# Patient Record
Sex: Female | Born: 1995 | Race: White | Hispanic: No | Smoking: Former smoker
Health system: Southern US, Community
[De-identification: ages and names within clinical notes are randomized; demographics above are authoritative.]

## PROBLEM LIST (undated history)

## (undated) DIAGNOSIS — J449 Chronic obstructive pulmonary disease, unspecified: Secondary | ICD-10-CM

## (undated) DIAGNOSIS — F419 Anxiety disorder, unspecified: Secondary | ICD-10-CM

## (undated) DIAGNOSIS — J45909 Unspecified asthma, uncomplicated: Secondary | ICD-10-CM

---

## 2021-09-16 ENCOUNTER — Ambulatory Visit: Payer: Medicaid Other | Admitting: Sports Medicine

## 2021-09-16 ENCOUNTER — Other Ambulatory Visit: Payer: Self-pay

## 2021-09-16 ENCOUNTER — Encounter: Payer: Self-pay | Admitting: Sports Medicine

## 2021-09-16 DIAGNOSIS — L84 Corns and callosities: Secondary | ICD-10-CM

## 2021-09-16 DIAGNOSIS — R61 Generalized hyperhidrosis: Secondary | ICD-10-CM

## 2021-09-16 DIAGNOSIS — L603 Nail dystrophy: Secondary | ICD-10-CM

## 2021-09-16 DIAGNOSIS — M79672 Pain in left foot: Secondary | ICD-10-CM

## 2021-09-16 DIAGNOSIS — M79671 Pain in right foot: Secondary | ICD-10-CM

## 2021-09-16 NOTE — Patient Instructions (Signed)
Soak with black tea at bedtime Use deodorant spray to feet at bedtime Use foot miracle cream in the AM for dry skin Take OTC hair skin and nail vitamin for 5th toe nails Avoid shoes that could rub toes or that are too tight

## 2021-09-16 NOTE — Progress Notes (Signed)
Subjective: Kathryn Wiley is a 26 y.o. female patient who presents to office for evaluation of bilateral foot pain secondary to callus skin especially at the heels reports that the skin gets so hard cracks has tried Band-Aid and soaking but it seems like it just comes back and she gets frustrated and tired.  Patient also reports that she did dance when she was younger and had a history of her toenails falling off denies any significant swelling warmth redness or any signs of infection to the toes or toenails.  Patient reports that she tends to wear sandals because they are more comfortable.  There are no problems to display for this patient.   Current Outpatient Medications on File Prior to Visit  Medication Sig Dispense Refill   buPROPion ER (WELLBUTRIN SR) 100 MG 12 hr tablet Take 100 mg by mouth every morning.     SYMBICORT 160-4.5 MCG/ACT inhaler Inhale into the lungs.     valACYclovir (VALTREX) 500 MG tablet Take 500 mg by mouth daily.     VENTOLIN HFA 108 (90 Base) MCG/ACT inhaler Inhale into the lungs.     No current facility-administered medications on file prior to visit.    No Known Allergies  Objective:  General: Alert and oriented x3 in no acute distress  Dermatology: Keratotic lesion present diffusely plantar heels and ball of feet bilaterally with skin lines transversing the lesions, no ecchymosis bilateral, all nails x 10 are well manicured with mildly thickened fifth toenails bilateral.  Nails appear to be well attached with no surrounding signs of infection.  Vascular: Dorsalis Pedis and Posterior Tibial pedal pulses 2/4, Capillary Fill Time 3 seconds, + pedal hair growth bilateral, no edema bilateral lower extremities, Temperature gradient slightly increased consistent with hyperhidrosis  Neurology: Gross sensation intact via light touch bilateral.  Musculoskeletal: Mild tenderness with palpation at the keratotic lesion site bilateral.  Assessment and Plan: Problem List  Items Addressed This Visit   None Visit Diagnoses     Callus    -  Primary   Nail dystrophy       Foot pain, bilateral       Hyperhidrosis             -Complete examination performed -Discussed treatment options -Parred keratoic lesion using a rotary bur sanding down the callused areas at bilateral heels and plantar forefoot without incident  -Encouraged daily skin emollients; sample of foot miracle cream provided -Encouraged use of pumice stone at home on her own after soaking with black tea twice weekly -Recommend patient to also get over-the-counter spray deodorant to use at bedtime for hyperhidrosis -Advised good supportive shoes that do not rub -Patient to return to office in 1 month or sooner if condition worsens.  If condition continues patient may benefit from combination of antifungal and antibiotic treatment for questionable pityriasis  Asencion Islam, DPM

## 2021-10-17 ENCOUNTER — Encounter: Payer: Self-pay | Admitting: Sports Medicine

## 2021-10-17 ENCOUNTER — Ambulatory Visit: Payer: Medicaid Other | Admitting: Sports Medicine

## 2021-10-17 DIAGNOSIS — L210 Seborrhea capitis: Secondary | ICD-10-CM | POA: Diagnosis not present

## 2021-10-17 DIAGNOSIS — M79671 Pain in right foot: Secondary | ICD-10-CM

## 2021-10-17 DIAGNOSIS — M79672 Pain in left foot: Secondary | ICD-10-CM

## 2021-10-17 DIAGNOSIS — R61 Generalized hyperhidrosis: Secondary | ICD-10-CM

## 2021-10-17 DIAGNOSIS — L84 Corns and callosities: Secondary | ICD-10-CM | POA: Diagnosis not present

## 2021-10-17 DIAGNOSIS — L603 Nail dystrophy: Secondary | ICD-10-CM

## 2021-10-17 MED ORDER — BENZOYL PEROXIDE-ERYTHROMYCIN 5-3 % EX GEL: Freq: Two times a day (BID) | CUTANEOUS | 0 refills | Status: DC

## 2021-10-17 NOTE — Progress Notes (Signed)
Subjective: Kathryn Wiley is a 26 y.o. female patient who returns to office for follow up of callus skin, sweaty feet, and thick nails, reports that things are getting better, reports that she still has to get the spray deodorant, has new sandals that are very comfortable.  There are no problems to display for this patient.   Current Outpatient Medications on File Prior to Visit  Medication Sig Dispense Refill   buPROPion ER (WELLBUTRIN SR) 100 MG 12 hr tablet Take 100 mg by mouth every morning.     SYMBICORT 160-4.5 MCG/ACT inhaler Inhale into the lungs.     valACYclovir (VALTREX) 500 MG tablet Take 500 mg by mouth daily.     VENTOLIN HFA 108 (90 Base) MCG/ACT inhaler Inhale into the lungs.     No current facility-administered medications on file prior to visit.    No Known Allergies  Objective:  General: Alert and oriented x3 in no acute distress  Dermatology: Keratotic lesion present diffusely plantar heels and ball of feet bilaterally with skin lines transversing the lesions and small pits consistent with pityriasis,  no ecchymosis bilateral, all nails x 10 are well manicured with mildly thickened fifth toenails bilateral.  Nails appear to be well attached with no surrounding signs of infection.  Vascular: Dorsalis Pedis and Posterior Tibial pedal pulses 2/4, Capillary Fill Time 3 seconds, + pedal hair growth bilateral, no edema bilateral lower extremities, Temperature gradient slightly increased consistent with hyperhidrosis  Neurology: Gross sensation intact via light touch bilateral.  Musculoskeletal: Mild tenderness with palpation at the keratotic lesion site bilateral.  Assessment and Plan: Problem List Items Addressed This Visit   None Visit Diagnoses     Callus    -  Primary   Pityriasis       Nail dystrophy       Hyperhidrosis       Foot pain, bilateral          -Complete examination performed -Re-Discussed treatment options -Parred keratoic lesion on right heel  using a 15 blade at the plantar heel without incident -Encouraged daily skin emollients and use of pumice stone at home on her own after soaking with black tea twice-three times weekly -Recommend patient to get the over-the-counter spray deodorant to use at bedtime for hyperhidrosis -Rx Erythromicin gel for patient to use as directed  -Advised good supportive shoes that do not rub and to continue with her cushioned sandals -Patient to return to office in 1 month or sooner if condition worsens.  If condition continues may benefit from lamisil if fails to continue to improve  Asencion Islam, DPM

## 2021-11-14 ENCOUNTER — Ambulatory Visit: Payer: Medicaid Other | Admitting: Sports Medicine

## 2022-01-29 ENCOUNTER — Emergency Department (HOSPITAL_BASED_OUTPATIENT_CLINIC_OR_DEPARTMENT_OTHER): Payer: Medicaid Other

## 2022-01-29 ENCOUNTER — Emergency Department (HOSPITAL_BASED_OUTPATIENT_CLINIC_OR_DEPARTMENT_OTHER)
Admission: EM | Admit: 2022-01-29 | Discharge: 2022-01-29 | Disposition: A | Discharge status: Home / Self Care | Payer: Medicaid Other | Attending: Emergency Medicine | Admitting: Emergency Medicine

## 2022-01-29 ENCOUNTER — Other Ambulatory Visit: Payer: Self-pay

## 2022-01-29 ENCOUNTER — Encounter (HOSPITAL_BASED_OUTPATIENT_CLINIC_OR_DEPARTMENT_OTHER): Payer: Self-pay

## 2022-01-29 DIAGNOSIS — R110 Nausea: Secondary | ICD-10-CM | POA: Insufficient documentation

## 2022-01-29 DIAGNOSIS — R1084 Generalized abdominal pain: Secondary | ICD-10-CM | POA: Insufficient documentation

## 2022-01-29 DIAGNOSIS — R1013 Epigastric pain: Secondary | ICD-10-CM | POA: Diagnosis present

## 2022-01-29 HISTORY — DX: Chronic obstructive pulmonary disease, unspecified: J44.9

## 2022-01-29 HISTORY — DX: Unspecified asthma, uncomplicated: J45.909

## 2022-01-29 HISTORY — DX: Anxiety disorder, unspecified: F41.9

## 2022-01-29 LAB — COMPREHENSIVE METABOLIC PANEL
ALT: 20 U/L (ref 0–44)
AST: 15 U/L (ref 15–41)
Albumin: 3.8 g/dL (ref 3.5–5.0)
Alkaline Phosphatase: 75 U/L (ref 38–126)
Anion gap: 4 — ABNORMAL LOW (ref 5–15)
BUN: 11 mg/dL (ref 6–20)
CO2: 24 mmol/L (ref 22–32)
Calcium: 8.9 mg/dL (ref 8.9–10.3)
Chloride: 110 mmol/L (ref 98–111)
Creatinine, Ser: 0.66 mg/dL (ref 0.44–1.00)
GFR, Estimated: >60 mL/min (ref >60)
Glucose, Bld: 89 mg/dL (ref 70–99)
Potassium: 4 mmol/L (ref 3.5–5.1)
Sodium: 138 mmol/L (ref 135–145)
Total Bilirubin: 0.7 mg/dL (ref 0.3–1.2)
Total Protein: 6.9 g/dL (ref 6.5–8.1)

## 2022-01-29 LAB — URINALYSIS, ROUTINE W REFLEX MICROSCOPIC
Bilirubin Urine: NEGATIVE
Glucose, UA: NEGATIVE mg/dL
Hgb urine dipstick: NEGATIVE
Ketones, ur: NEGATIVE mg/dL
Leukocytes,Ua: NEGATIVE
Nitrite: NEGATIVE
Protein, ur: NEGATIVE mg/dL
Specific Gravity, Urine: 1.025 (ref 1.005–1.030)
pH: 7 (ref 5.0–8.0)

## 2022-01-29 LAB — CBC WITH DIFFERENTIAL/PLATELET
Abs Immature Granulocytes: 0.02 10*3/uL (ref 0.00–0.07)
Basophils Absolute: 0 10*3/uL (ref 0.0–0.1)
Basophils Relative: 0 %
Eosinophils Absolute: 0.2 10*3/uL (ref 0.0–0.5)
Eosinophils Relative: 2 %
HCT: 43.7 % (ref 36.0–46.0)
Hemoglobin: 14.8 g/dL (ref 12.0–15.0)
Immature Granulocytes: 0 %
Lymphocytes Relative: 33 %
Lymphs Abs: 2.7 10*3/uL (ref 0.7–4.0)
MCH: 30.8 pg (ref 26.0–34.0)
MCHC: 33.9 g/dL (ref 30.0–36.0)
MCV: 90.9 fL (ref 80.0–100.0)
Monocytes Absolute: 0.5 10*3/uL (ref 0.1–1.0)
Monocytes Relative: 6 %
Neutro Abs: 4.7 10*3/uL (ref 1.7–7.7)
Neutrophils Relative %: 59 %
Platelets: 253 10*3/uL (ref 150–400)
RBC: 4.81 MIL/uL (ref 3.87–5.11)
RDW: 13.5 % (ref 11.5–15.5)
WBC: 8.1 10*3/uL (ref 4.0–10.5)
nRBC: 0 % (ref 0.0–0.2)

## 2022-01-29 LAB — PREGNANCY, URINE: Preg Test, Ur: NEGATIVE

## 2022-01-29 LAB — LIPASE, BLOOD: Lipase: 24 U/L (ref 11–51)

## 2022-01-29 IMAGING — CT CT ABD-PELV W/ CM
2 of 4 series · 16 of 46 positions shown, 18 images · IV contrast (Omnipaque)
Comparison: None Available.

CLINICAL DATA: Acute abdominal pain nonlocalized.

EXAM:
CT ABDOMEN AND PELVIS WITH CONTRAST
TECHNIQUE: Multidetector CT imaging of the abdomen and pelvis was performed
using the standard protocol following bolus administration of
intravenous contrast.

[Series 2: axial st · axial · 0.98mm/px · z∈[-678,-263]mm · 13 of 91 slices shown, 15 images]
[im 4/91  soft-tissue]
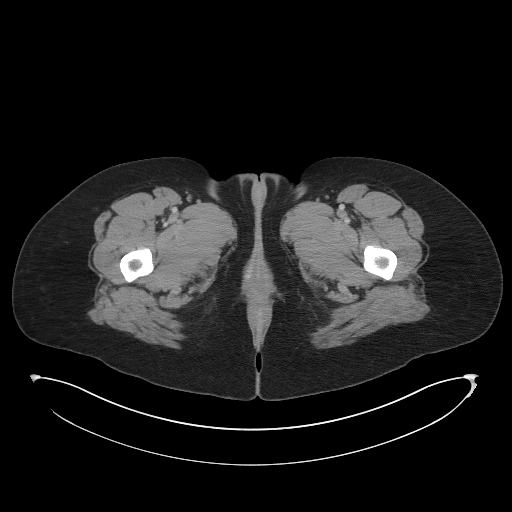
[im 4/91  bone]
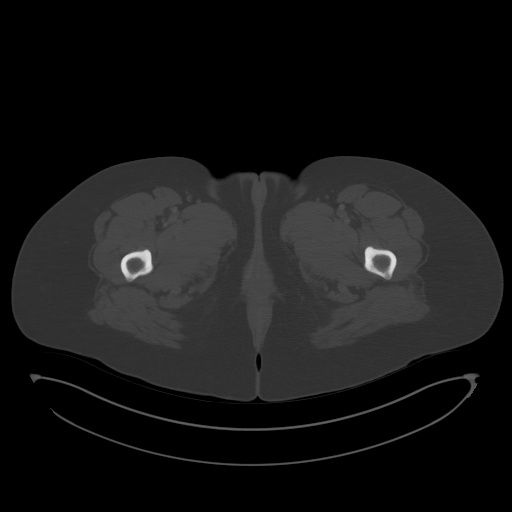
[im 12/91  soft-tissue]
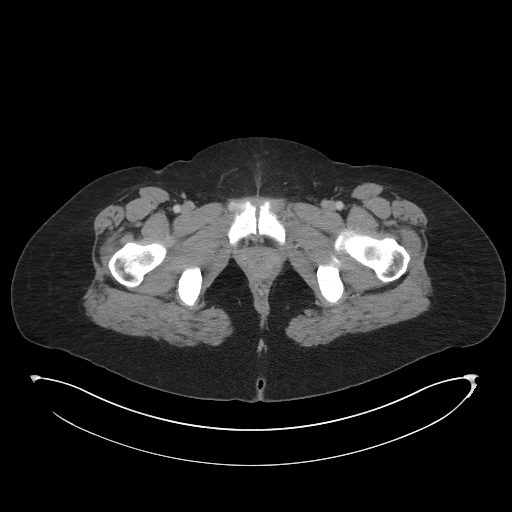
[im 19/91  soft-tissue]
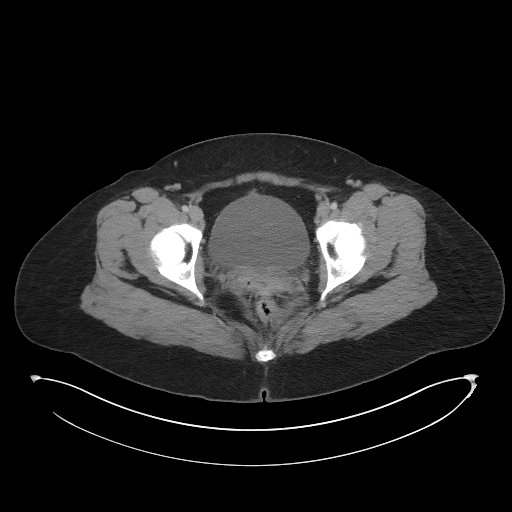
[im 27/91  soft-tissue]
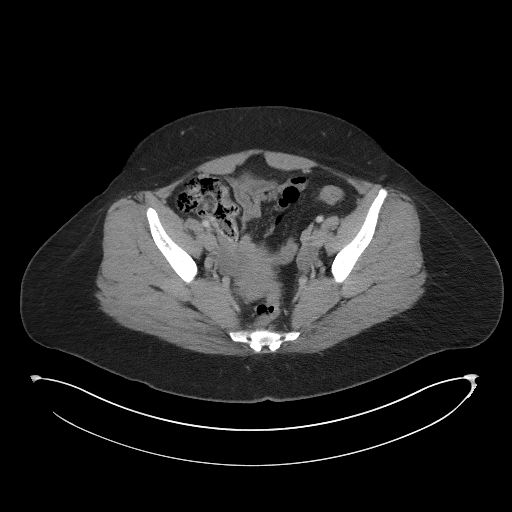
[im 31/91  soft-tissue]
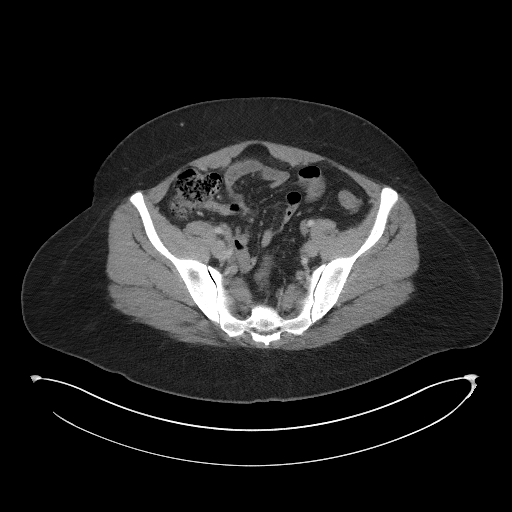
[im 38/91  soft-tissue]
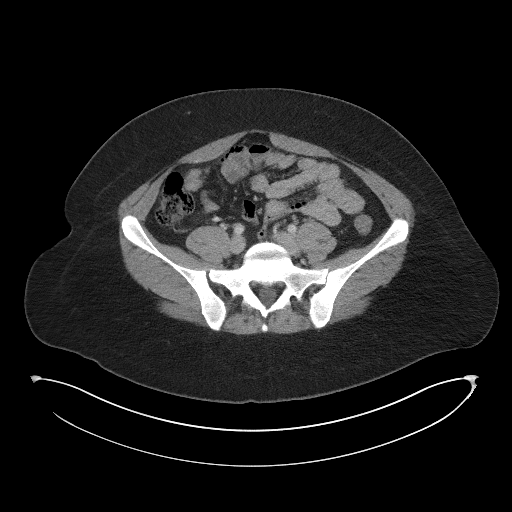
[im 46/91  soft-tissue]
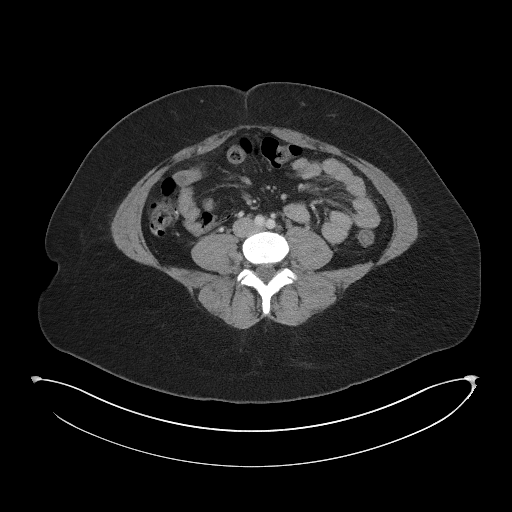
[im 53/91  soft-tissue]
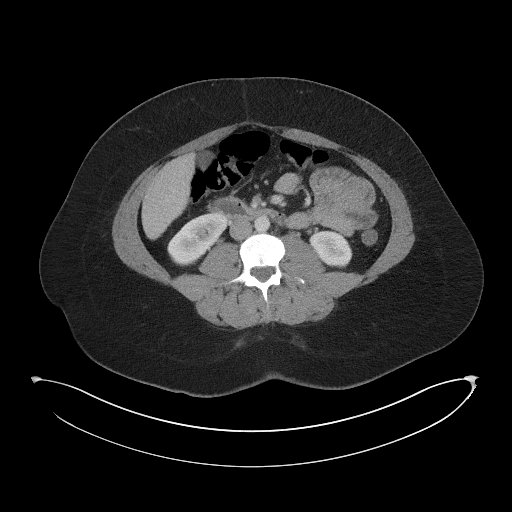
[im 61/91  soft-tissue]
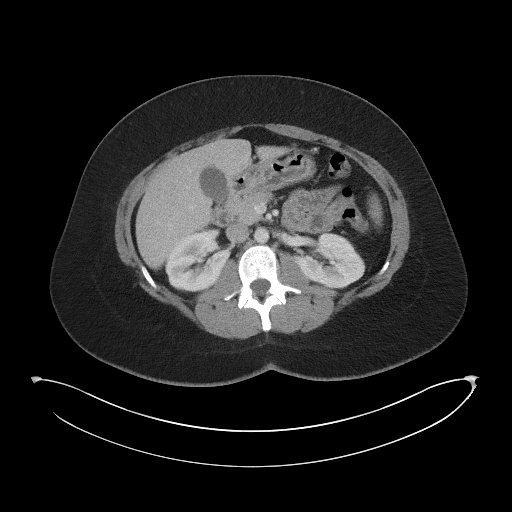
[im 61/91  bone]
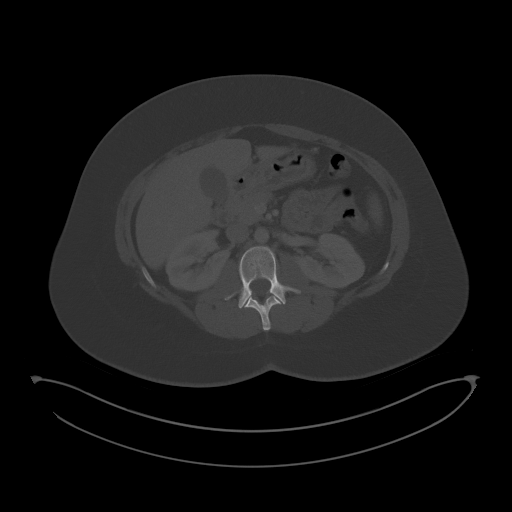
[im 64/91  soft-tissue]
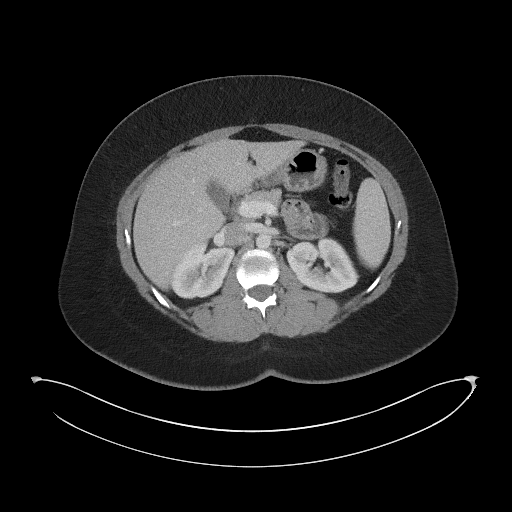
[im 72/91  soft-tissue]
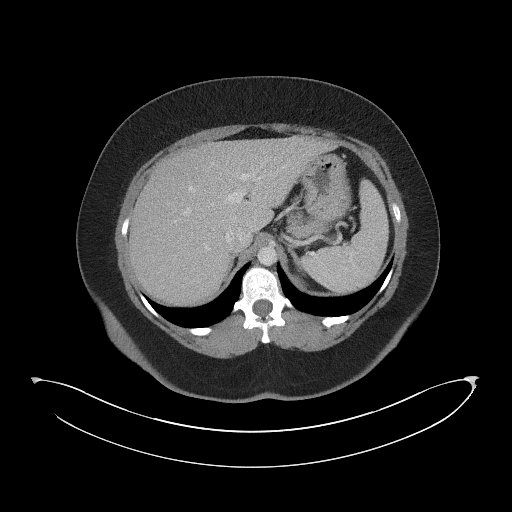
[im 79/91  soft-tissue]
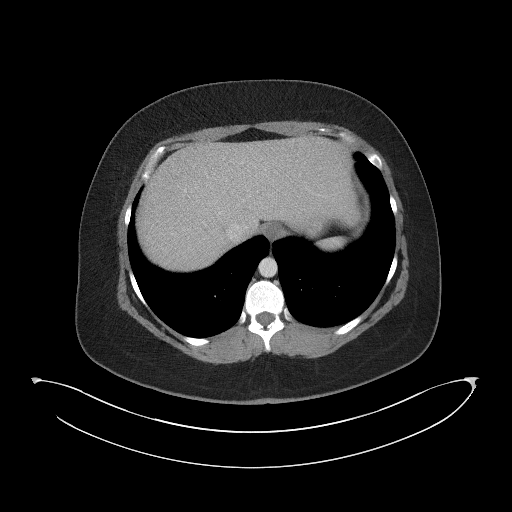
[im 87/91  soft-tissue]
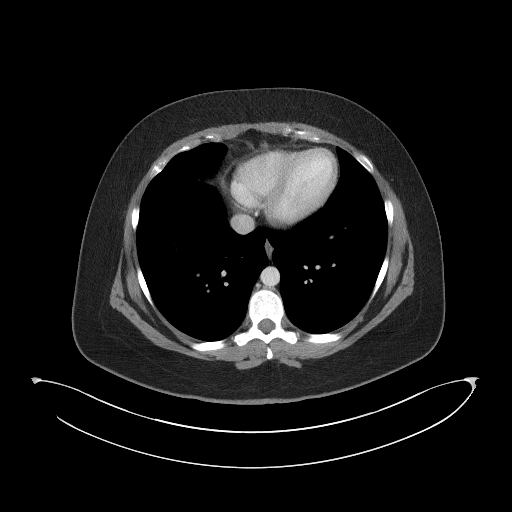

[Series 5: coronal st · coronal · 0.95mm/px · 3 of 118 slices shown]
[im 40/118  soft-tissue]
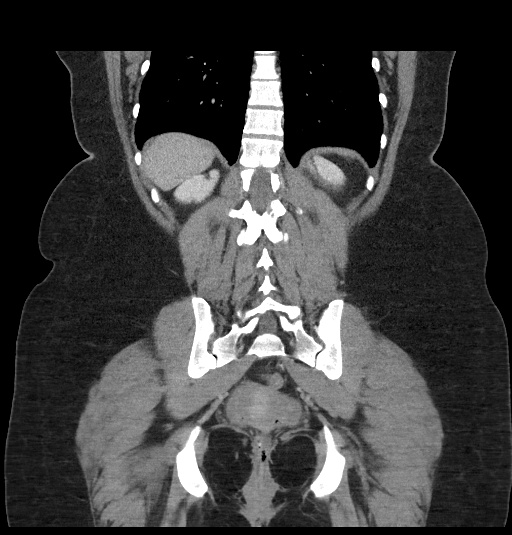
[im 53/118  soft-tissue]
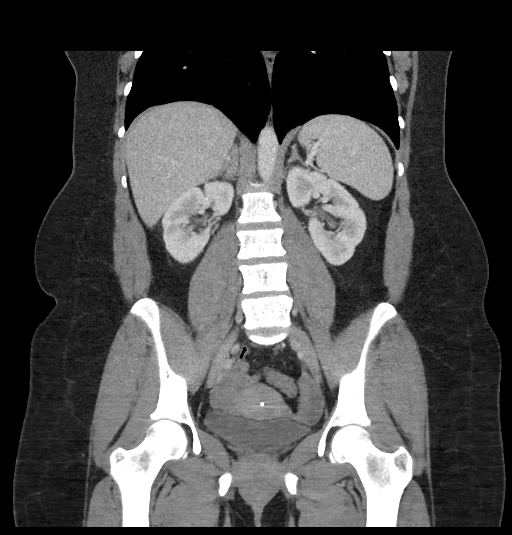
[im 66/118  soft-tissue]
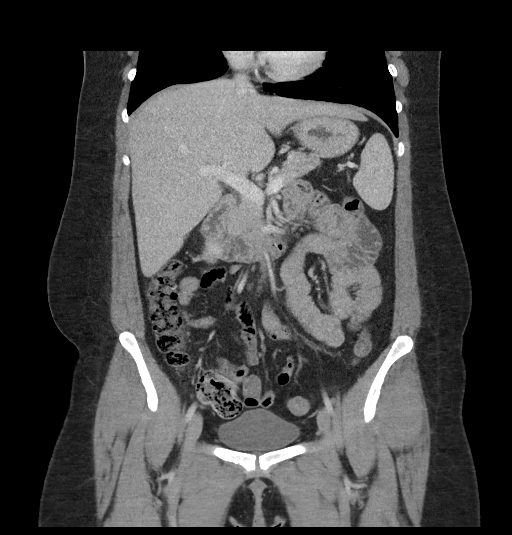

[16 of 46 positions shown; findings below may reference images not displayed]

RADIATION DOSE REDUCTION: This exam was performed according to the
departmental dose-optimization program which includes automated
exposure control, adjustment of the mA and/or kV according to
patient size and/or use of iterative reconstruction technique.

CONTRAST:  100mL OMNIPAQUE IOHEXOL 300 MG/ML  SOLN
FINDINGS: Lower chest: Patchy ground-glass opacities in the right lung base
are favored infectious or inflammatory.

Hepatobiliary: No suspicious hepatic lesion. Gallbladder is
unremarkable. No biliary ductal dilation.

Pancreas: No pancreatic ductal dilation or evidence of acute
inflammation.

Spleen: No splenomegaly or focal splenic lesion.

Adrenals/Urinary Tract: Bilateral adrenal glands appear normal. No
hydronephrosis. Punctate nonobstructive left lower pole renal
calculus. Kidneys demonstrate symmetric enhancement. Urinary bladder
is unremarkable for degree of distension.

Stomach/Bowel: No radiopaque enteric contrast material was
administered. Stomach is unremarkable for degree of distension. No
pathologic dilation of large or small bowel. The appendix and
terminal ileum appear normal. Few scattered colonic diverticula
without findings of acute diverticulitis.

Vascular/Lymphatic: Normal caliber abdominal aorta. No
pathologically enlarged abdominal or pelvic lymph nodes.

Reproductive: Intrauterine device appears appropriate in
positioning. No suspicious adnexal mass.

Other: Trace pelvic free fluid is within physiologic normal limits.

Musculoskeletal: No acute osseous abnormality.
IMPRESSION: 1. No acute abnormality in the abdomen or pelvis.
2. Patchy ground-glass opacities in the right lung base are favored
infectious or inflammatory.
3. Scattered colonic diverticulosis without findings of acute
diverticulitis.

## 2022-01-29 MED ORDER — IOHEXOL 300 MG/ML  SOLN
100.0000 mL | Freq: Once | INTRAMUSCULAR | Status: AC | PRN
  Administered 2022-01-29: 100 mL via INTRAVENOUS

## 2022-01-29 MED ORDER — HYDROMORPHONE HCL 1 MG/ML IJ SOLN
1.0000 mg | Freq: Once | INTRAMUSCULAR | Status: AC
  Administered 2022-01-29: 1 mg via INTRAVENOUS
  Filled 2022-01-29: qty 1

## 2022-01-29 MED ORDER — PROMETHAZINE HCL 25 MG RE SUPP: 25.0000 mg | Freq: Four times a day (QID) | RECTAL | 0 refills | Status: DC | PRN

## 2022-01-29 MED ORDER — MORPHINE SULFATE (PF) 4 MG/ML IV SOLN
4.0000 mg | Freq: Once | INTRAVENOUS | Status: AC
  Administered 2022-01-29: 4 mg via INTRAVENOUS
  Filled 2022-01-29: qty 1

## 2022-01-29 MED ORDER — SODIUM CHLORIDE 0.9 % IV BOLUS
1000.0000 mL | Freq: Once | INTRAVENOUS | Status: AC
  Administered 2022-01-29: 1000 mL via INTRAVENOUS

## 2022-01-29 MED ORDER — ONDANSETRON HCL 4 MG/2ML IJ SOLN
4.0000 mg | Freq: Once | INTRAMUSCULAR | Status: AC
  Administered 2022-01-29: 4 mg via INTRAVENOUS
  Filled 2022-01-29: qty 2

## 2022-01-29 NOTE — Discharge Instructions (Signed)
Please read the information about bland diet is attached to these discharge papers.  This is what you should follow until you are able to tolerate heavier and more flavorful fluid.  Follow-up with the gastroenterologist attached to these discharge papers.  It is good for you to be established with 1 in the event of recurring symptoms.  Especially with a family history of IBS/IBD.

## 2022-01-29 NOTE — ED Triage Notes (Signed)
Patient c/o mid abd pressure/cramping now traveling to right side of abd with nauea.

## 2022-01-29 NOTE — ED Provider Notes (Signed)
[facility] Provider Note   CSN: [id number] Arrival date & time: 01/29/22  1350     History  Chief Complaint  Patient presents with   Abdominal Pain   Nausea    Kathryn Wiley is a 26 y.o. female with no known past medical history presenting today with abdominal pain.  Says that it started yesterday in the epigastrium/periumbilical region and has migrated to the right side of the abdomen.  She has had associated nausea but this was relieved with at home Zofran.  Says she is having bowel movements but they are mostly "fatty."  Took Imodium and Pepto-Bismol yesterday and today without any relief.  No history of abdominal surgery, antibiotic use, travel or changes in diet.  No fevers or chills.  No urinary or vaginal symptoms.  Has an IUD that she has had for 3 years, no longer gets menstrual periods.   Abdominal Pain Associated symptoms: nausea   Associated symptoms: no dysuria, no vaginal bleeding and no vaginal discharge        Home Medications Prior to Admission medications   Medication Sig Start Date End Date Taking? Authorizing Provider  benzoyl peroxide-erythromycin (BENZAMYCIN) gel Apply topically 2 (two) times daily. 10/17/21   Landis Martins, DPM  buPROPion ER (WELLBUTRIN SR) 100 MG 12 hr tablet Take 100 mg by mouth every morning. 09/01/21   [provider]  SYMBICORT 160-4.5 MCG/ACT inhaler Inhale into the lungs. 07/02/21   [provider]  valACYclovir (VALTREX) 500 MG tablet Take 500 mg by mouth daily. 09/15/21   [provider]  VENTOLIN HFA 108 (90 Base) MCG/ACT inhaler Inhale into the lungs. 07/01/21   [provider]      Allergies    Patient has no known allergies.    Review of Systems   Review of Systems  Gastrointestinal:  Positive for abdominal pain and nausea.  Genitourinary:  Negative for dysuria, pelvic pain, vaginal bleeding, vaginal discharge and vaginal pain.   Physical Exam Updated Vital  Signs BP (!) 142/101 (BP Location: Left Arm)   Pulse 91   Temp 97.8 F (36.6 C) (Oral)   Resp 18   Ht 5\' 3"  (1.6 m)   Wt 92.5 kg   LMP  (LMP Unknown)   SpO2 100%   BMI 36.14 kg/m  Physical Exam Vitals and nursing note reviewed.  Constitutional:      Appearance: Normal appearance.  HENT:     Head: Normocephalic and atraumatic.  Eyes:     General: No scleral icterus.    Conjunctiva/sclera: Conjunctivae normal.  Cardiovascular:     Rate and Rhythm: Normal rate and regular rhythm.  Pulmonary:     Effort: Pulmonary effort is normal. No respiratory distress.  Abdominal:     Palpations: Abdomen is soft.     Tenderness: There is abdominal tenderness in the right upper quadrant, right lower quadrant, periumbilical area and left lower quadrant.     Hernia: No hernia is present.  Skin:    Findings: No rash.  Neurological:     Mental Status: She is alert.  Psychiatric:        Mood and Affect: Mood normal.     ED Results / Procedures / Treatments   Labs (all labs ordered are listed, but only abnormal results are displayed) Labs Reviewed  COMPREHENSIVE METABOLIC PANEL - Abnormal; Notable for the following components:      Result Value   Anion gap 4 (*)    All other components  within normal limits  LIPASE, BLOOD  CBC WITH DIFFERENTIAL/PLATELET  URINALYSIS, ROUTINE W REFLEX MICROSCOPIC  PREGNANCY, URINE    EKG None  Radiology CT ABDOMEN PELVIS W CONTRAST  Result Date: 01/29/2022 CLINICAL DATA:  Acute abdominal pain nonlocalized. EXAM: CT ABDOMEN AND PELVIS WITH CONTRAST TECHNIQUE: Multidetector CT imaging of the abdomen and pelvis was performed using the standard protocol following bolus administration of intravenous contrast. RADIATION DOSE REDUCTION: This exam was performed according to the departmental dose-optimization program which includes automated exposure control, adjustment of the mA and/or kV according to patient size and/or use of iterative reconstruction  technique. CONTRAST:  165mL OMNIPAQUE IOHEXOL 300 MG/ML  SOLN COMPARISON:  None Available. FINDINGS: Lower chest: Patchy ground-glass opacities in the right lung base are favored infectious or inflammatory. Hepatobiliary: No suspicious hepatic lesion. Gallbladder is unremarkable. No biliary ductal dilation. Pancreas: No pancreatic ductal dilation or evidence of acute inflammation. Spleen: No splenomegaly or focal splenic lesion. Adrenals/Urinary Tract: Bilateral adrenal glands appear normal. No hydronephrosis. Punctate nonobstructive left lower pole renal calculus. Kidneys demonstrate symmetric enhancement. Urinary bladder is unremarkable for degree of distension. Stomach/Bowel: No radiopaque enteric contrast material was administered. Stomach is unremarkable for degree of distension. No pathologic dilation of large or small bowel. The appendix and terminal ileum appear normal. Few scattered colonic diverticula without findings of acute diverticulitis. Vascular/Lymphatic: Normal caliber abdominal aorta. No pathologically enlarged abdominal or pelvic lymph nodes. Reproductive: Intrauterine device appears appropriate in positioning. No suspicious adnexal mass. Other: Trace pelvic free fluid is within physiologic normal limits. Musculoskeletal: No acute osseous abnormality. IMPRESSION: 1. No acute abnormality in the abdomen or pelvis. 2. Patchy ground-glass opacities in the right lung base are favored infectious or inflammatory. 3. Scattered colonic diverticulosis without findings of acute diverticulitis. Electronically Signed   By: Dahlia Bailiff M.D.   On: 01/29/2022 16:09    Procedures Procedures    Medications Ordered in ED Medications  HYDROmorphone (DILAUDID) injection 1 mg (has no administration in time range)  sodium chloride 0.9 % bolus 1,000 mL (0 mLs Intravenous Stopped 01/29/22 1614)  ondansetron (ZOFRAN) injection 4 mg (4 mg Intravenous Given 01/29/22 1446)  morphine (PF) 4 MG/ML injection 4 mg (4  mg Intravenous Given 01/29/22 1445)  iohexol (OMNIPAQUE) 300 MG/ML solution 100 mL (100 mLs Intravenous Contrast Given 01/29/22 1556)    ED Course/ Medical Decision Making/ A&P                           Medical Decision Making Amount and/or Complexity of Data Reviewed Labs: ordered. Radiology: ordered.  Risk Prescription drug management.   This patient presents to the ED for concern of abdominal pain and nausea. The differential diagnosis for generalized abdominal pain includes, but is not limited to AAA, gastroenteritis, appendicitis, Bowel obstruction, Bowel perforation. Gastroparesis, DKA, Hernia, Inflammatory bowel disease, mesenteric ischemia, pancreatitis, peritonitis SBP, volvulus.    This is not an exhaustive differential.    Past Medical History / Co-morbidities / Social History: Obesity   Additional history: No previous records   Physical Exam: Physical exam performed. The pertinent findings include: Generalized abdominal tenderness  Lab Tests: I ordered, and personally interpreted labs.  The pertinent results include: Completely negative labs   Imaging Studies: I ordered imaging studies including CTAP. I independently visualized and interpreted imaging which showed no explantions of her pain. I agree with the radiologist interpretation.    Medications: I ordered medication including IVF, morphine and zofran.  Reevaluation shows  consistent pain.  Given Dilaudid.  Reevaluation of the patient after these medicines showed that the patient improved. I have reviewed the patients home medicines and have made adjustments as needed.  Patient is without epigastric pain, no GI cocktail attempted at this time.   Disposition: After consideration of the diagnostic results and the patients response to treatment, I feel that patient is stable for discharge home.  Vital signs of been stable since her arrival.  Afebrile, not tachycardic.  Normal white count.  CT abdomen pelvis  questioned right lower lobe pneumonia however patient has no symptoms of this.  Not short of breath.  Again, vital signs are stable.  Will not treat for pneumonia especially being that the antibiotics may cause further GI upset.  She is agreeable to this.  I suspect that she is suffering from a gastroenteritis or potentially new onset IBS/IBD.  She will follow-up with GI.  She and her partner are agreeable to this.  P.o. challenged successfully will be discharged in stable condition.    Final Clinical Impression(s) / ED Diagnoses Final diagnoses:  Generalized abdominal pain  Nausea    Rx / DC Orders ED Discharge Orders          Ordered    promethazine (PHENERGAN) 25 MG suppository  Every 6 hours PRN        01/29/22 1650           Results and diagnoses were explained to the patient. Return precautions discussed in full. Patient had no additional questions and expressed complete understanding.   This chart was dictated using voice recognition software.  Despite best efforts to proofread,  errors can occur which can change the documentation meaning.    Darliss Ridgel 01/29/22 1741    Wyvonnia Dusky, MD 01/29/22 1745

## 2022-01-31 ENCOUNTER — Inpatient Hospital Stay (HOSPITAL_BASED_OUTPATIENT_CLINIC_OR_DEPARTMENT_OTHER)
Admission: EM | Admit: 2022-01-31 | Discharge: 2022-02-04 | DRG: 392 | Disposition: A | Discharge status: Home / Self Care | Payer: Medicaid Other | Attending: Internal Medicine | Admitting: Internal Medicine

## 2022-01-31 ENCOUNTER — Encounter (HOSPITAL_BASED_OUTPATIENT_CLINIC_OR_DEPARTMENT_OTHER): Payer: Self-pay | Admitting: Emergency Medicine

## 2022-01-31 ENCOUNTER — Emergency Department (HOSPITAL_BASED_OUTPATIENT_CLINIC_OR_DEPARTMENT_OTHER): Payer: Medicaid Other

## 2022-01-31 DIAGNOSIS — F1210 Cannabis abuse, uncomplicated: Secondary | ICD-10-CM | POA: Diagnosis present

## 2022-01-31 DIAGNOSIS — E876 Hypokalemia: Secondary | ICD-10-CM | POA: Diagnosis present

## 2022-01-31 DIAGNOSIS — Z79899 Other long term (current) drug therapy: Secondary | ICD-10-CM

## 2022-01-31 DIAGNOSIS — F1910 Other psychoactive substance abuse, uncomplicated: Secondary | ICD-10-CM | POA: Diagnosis present

## 2022-01-31 DIAGNOSIS — J45909 Unspecified asthma, uncomplicated: Secondary | ICD-10-CM | POA: Diagnosis present

## 2022-01-31 DIAGNOSIS — F1290 Cannabis use, unspecified, uncomplicated: Secondary | ICD-10-CM | POA: Diagnosis present

## 2022-01-31 DIAGNOSIS — A0811 Acute gastroenteropathy due to Norwalk agent: Principal | ICD-10-CM | POA: Diagnosis present

## 2022-01-31 DIAGNOSIS — E669 Obesity, unspecified: Secondary | ICD-10-CM | POA: Diagnosis present

## 2022-01-31 DIAGNOSIS — F419 Anxiety disorder, unspecified: Secondary | ICD-10-CM | POA: Diagnosis present

## 2022-01-31 DIAGNOSIS — Z6837 Body mass index (BMI) 37.0-37.9, adult: Secondary | ICD-10-CM

## 2022-01-31 DIAGNOSIS — F17210 Nicotine dependence, cigarettes, uncomplicated: Secondary | ICD-10-CM | POA: Diagnosis present

## 2022-01-31 DIAGNOSIS — R112 Nausea with vomiting, unspecified: Secondary | ICD-10-CM | POA: Diagnosis present

## 2022-01-31 DIAGNOSIS — J441 Chronic obstructive pulmonary disease with (acute) exacerbation: Secondary | ICD-10-CM | POA: Diagnosis present

## 2022-01-31 DIAGNOSIS — R1084 Generalized abdominal pain: Principal | ICD-10-CM

## 2022-01-31 DIAGNOSIS — N760 Acute vaginitis: Secondary | ICD-10-CM | POA: Diagnosis present

## 2022-01-31 DIAGNOSIS — B9689 Other specified bacterial agents as the cause of diseases classified elsewhere: Secondary | ICD-10-CM | POA: Diagnosis present

## 2022-01-31 DIAGNOSIS — K920 Hematemesis: Secondary | ICD-10-CM | POA: Diagnosis present

## 2022-01-31 DIAGNOSIS — Z7951 Long term (current) use of inhaled steroids: Secondary | ICD-10-CM

## 2022-01-31 LAB — URINALYSIS, ROUTINE W REFLEX MICROSCOPIC
Bilirubin Urine: NEGATIVE
Glucose, UA: NEGATIVE mg/dL
Hgb urine dipstick: NEGATIVE
Ketones, ur: 15 mg/dL — AB
Leukocytes,Ua: NEGATIVE
Nitrite: NEGATIVE
Protein, ur: NEGATIVE mg/dL
Specific Gravity, Urine: 1.025 (ref 1.005–1.030)
pH: 5.5 (ref 5.0–8.0)

## 2022-01-31 LAB — CBC WITH DIFFERENTIAL/PLATELET
Abs Immature Granulocytes: 0.05 10*3/uL (ref 0.00–0.07)
Basophils Absolute: 0 10*3/uL (ref 0.0–0.1)
Basophils Relative: 0 %
Eosinophils Absolute: 0 10*3/uL (ref 0.0–0.5)
Eosinophils Relative: 0 %
HCT: 44.7 % (ref 36.0–46.0)
Hemoglobin: 15.4 g/dL — ABNORMAL HIGH (ref 12.0–15.0)
Immature Granulocytes: 0 %
Lymphocytes Relative: 3 %
Lymphs Abs: 0.5 10*3/uL — ABNORMAL LOW (ref 0.7–4.0)
MCH: 30.3 pg (ref 26.0–34.0)
MCHC: 34.5 g/dL (ref 30.0–36.0)
MCV: 87.8 fL (ref 80.0–100.0)
Monocytes Absolute: 0.4 10*3/uL (ref 0.1–1.0)
Monocytes Relative: 3 %
Neutro Abs: 13.6 10*3/uL — ABNORMAL HIGH (ref 1.7–7.7)
Neutrophils Relative %: 94 %
Platelets: 260 10*3/uL (ref 150–400)
RBC: 5.09 MIL/uL (ref 3.87–5.11)
RDW: 13.2 % (ref 11.5–15.5)
WBC: 14.6 10*3/uL — ABNORMAL HIGH (ref 4.0–10.5)
nRBC: 0 % (ref 0.0–0.2)

## 2022-01-31 LAB — COMPREHENSIVE METABOLIC PANEL
ALT: 22 U/L (ref 0–44)
AST: 17 U/L (ref 15–41)
Albumin: 4.3 g/dL (ref 3.5–5.0)
Alkaline Phosphatase: 85 U/L (ref 38–126)
Anion gap: 8 (ref 5–15)
BUN: 14 mg/dL (ref 6–20)
CO2: 19 mmol/L — ABNORMAL LOW (ref 22–32)
Calcium: 9.3 mg/dL (ref 8.9–10.3)
Chloride: 112 mmol/L — ABNORMAL HIGH (ref 98–111)
Creatinine, Ser: 0.71 mg/dL (ref 0.44–1.00)
GFR, Estimated: >60 mL/min (ref >60)
Glucose, Bld: 119 mg/dL — ABNORMAL HIGH (ref 70–99)
Potassium: 3.8 mmol/L (ref 3.5–5.1)
Sodium: 139 mmol/L (ref 135–145)
Total Bilirubin: 0.9 mg/dL (ref 0.3–1.2)
Total Protein: 7.5 g/dL (ref 6.5–8.1)

## 2022-01-31 LAB — WET PREP, GENITAL
Sperm: NONE SEEN
Trich, Wet Prep: NONE SEEN
WBC, Wet Prep HPF POC: <10 (ref <10)
Yeast Wet Prep HPF POC: NONE SEEN

## 2022-01-31 LAB — PREGNANCY, URINE: Preg Test, Ur: NEGATIVE

## 2022-01-31 LAB — LIPASE, BLOOD: Lipase: 22 U/L (ref 11–51)

## 2022-01-31 MED ORDER — HYDROMORPHONE HCL 1 MG/ML IJ SOLN
1.0000 mg | Freq: Once | INTRAMUSCULAR | Status: AC
  Administered 2022-01-31: 1 mg via INTRAVENOUS
  Filled 2022-01-31: qty 1

## 2022-01-31 MED ORDER — LACTATED RINGERS IV BOLUS
2000.0000 mL | Freq: Once | INTRAVENOUS | Status: AC
  Administered 2022-01-31: 2000 mL via INTRAVENOUS

## 2022-01-31 MED ORDER — IOHEXOL 300 MG/ML  SOLN
100.0000 mL | Freq: Once | INTRAMUSCULAR | Status: AC | PRN
  Administered 2022-01-31: 100 mL via INTRAVENOUS

## 2022-01-31 MED ORDER — ACETAMINOPHEN 325 MG PO TABS
650.0000 mg | ORAL_TABLET | Freq: Four times a day (QID) | ORAL | Status: DC | PRN
  Administered 2022-02-03 (×2): 650 mg via ORAL
  Filled 2022-01-31 (×2): qty 2

## 2022-01-31 MED ORDER — HYDROMORPHONE HCL 1 MG/ML IJ SOLN
1.0000 mg | INTRAMUSCULAR | Status: DC | PRN
  Administered 2022-02-01 – 2022-02-04 (×16): 1 mg via INTRAVENOUS
  Filled 2022-01-31 (×21): qty 1

## 2022-01-31 MED ORDER — ONDANSETRON HCL 4 MG/2ML IJ SOLN
4.0000 mg | Freq: Once | INTRAMUSCULAR | Status: AC
  Administered 2022-01-31: 4 mg via INTRAVENOUS
  Filled 2022-01-31: qty 2

## 2022-01-31 MED ORDER — METRONIDAZOLE 500 MG PO TABS
500.0000 mg | ORAL_TABLET | Freq: Once | ORAL | Status: AC
  Administered 2022-01-31: 500 mg via ORAL
  Filled 2022-01-31: qty 1

## 2022-01-31 MED ORDER — ONDANSETRON HCL 4 MG/2ML IJ SOLN
4.0000 mg | Freq: Four times a day (QID) | INTRAMUSCULAR | Status: DC | PRN
  Administered 2022-02-01: 4 mg via INTRAVENOUS
  Filled 2022-01-31: qty 2

## 2022-01-31 NOTE — ED Triage Notes (Addendum)
Pt returns with abd pain and NVD; was seen here 2 days ago for same

## 2022-01-31 NOTE — ED Notes (Signed)
Pt transported to radiology.

## 2022-01-31 NOTE — ED Provider Notes (Signed)
[facility] Provider Note   CSN: [id number] Arrival date & time: 01/31/22  1535     History  Chief Complaint  Patient presents with   Abdominal Pain   Emesis    Kathryn Wiley is a 26 y.o. female.  26 year old female presents with her significant other for evaluation of abdominal pain, vomiting, and diarrhea.  She was evaluated 2 days ago and had a CT scan done which was unremarkable.  She states yesterday she had some improvement in symptoms however today it is worse than when she was here Thursday.  Last episode of emesis just prior to arrival.  Last time she tolerated p.o. intake was last night.  Denies fever, chills.  Patient on interview is writhing in pain.  Patient does have history of kidney stones.  States this pain is worse.  Denies dysuria, hematuria, vaginal discharge.  States as of today she has had small amounts of hematemesis.  Denies fever cough.  No blood in her stool.  The history is provided by the patient. No language interpreter was used.       Home Medications Prior to Admission medications   Medication Sig Start Date End Date Taking? Authorizing Provider  benzoyl peroxide-erythromycin (BENZAMYCIN) gel Apply topically 2 (two) times daily. 10/17/21   Landis Martins, DPM  buPROPion ER (WELLBUTRIN SR) 100 MG 12 hr tablet Take 100 mg by mouth every morning. 09/01/21   [provider]  promethazine (PHENERGAN) 25 MG suppository Place 1 suppository (25 mg total) rectally every 6 (six) hours as needed for nausea or vomiting. 01/29/22   Redwine, Madison A, PA-C  SYMBICORT 160-4.5 MCG/ACT inhaler Inhale into the lungs. 07/02/21   [provider]  valACYclovir (VALTREX) 500 MG tablet Take 500 mg by mouth daily. 09/15/21   [provider]  VENTOLIN HFA 108 (90 Base) MCG/ACT inhaler Inhale into the lungs. 07/01/21   [provider]      Allergies    Patient has no known allergies.    Review of Systems   Review  of Systems  All other systems reviewed and are negative.   Physical Exam Updated Vital Signs BP 112/71   Pulse 90   Temp 98.5 F (36.9 C) (Oral)   Resp (!) 24   LMP  (LMP Unknown)   SpO2 98%  Physical Exam Vitals and nursing note reviewed. Exam conducted with a chaperone present.  Constitutional:      General: She is not in acute distress.    Appearance: Normal appearance. She is not ill-appearing.  HENT:     Head: Normocephalic and atraumatic.     Nose: Nose normal.  Eyes:     General: No scleral icterus.    Extraocular Movements: Extraocular movements intact.     Conjunctiva/sclera: Conjunctivae normal.  Cardiovascular:     Rate and Rhythm: Regular rhythm.     Pulses: Normal pulses.     Heart sounds: Normal heart sounds.  Pulmonary:     Effort: Pulmonary effort is normal. No respiratory distress.     Breath sounds: Normal breath sounds. No wheezing or rales.  Abdominal:     General: There is no distension.     Palpations: Abdomen is soft.     Tenderness: There is abdominal tenderness. There is right CVA tenderness and left CVA tenderness. There is no guarding.  Genitourinary:    Vagina: Normal.     Cervix: No cervical motion tenderness, discharge or erythema.     Adnexa: Right  adnexa normal and left adnexa normal.  Musculoskeletal:        General: Normal range of motion.     Cervical back: Normal range of motion.  Skin:    General: Skin is warm and dry.  Neurological:     General: No focal deficit present.     Mental Status: She is alert. Mental status is at baseline.     ED Results / Procedures / Treatments   Labs (all labs ordered are listed, but only abnormal results are displayed) Labs Reviewed  URINALYSIS, ROUTINE W REFLEX MICROSCOPIC - Abnormal; Notable for the following components:      Result Value   Ketones, ur 15 (*)    All other components within normal limits  PREGNANCY, URINE  LIPASE, BLOOD  COMPREHENSIVE METABOLIC PANEL  CBC WITH  DIFFERENTIAL/PLATELET    EKG None  Radiology No results found.  Procedures Procedures    Medications Ordered in ED Medications  HYDROmorphone (DILAUDID) injection 1 mg (has no administration in time range)  ondansetron (ZOFRAN) injection 4 mg (has no administration in time range)  lactated ringers bolus 2,000 mL (has no administration in time range)    ED Course/ Medical Decision Making/ A&P                           Medical Decision Making Amount and/or Complexity of Data Reviewed Labs: ordered. Radiology: ordered.  Risk Prescription drug management. Decision regarding hospitalization.   Medical Decision Making / ED Course   This patient presents to the ED for concern of abdominal pain, this involves an extensive number of treatment options, and is a complaint that carries with it a high risk of complications and morbidity.  The differential diagnosis includes gastroenteritis, diverticulitis, appendicitis, pancreatitis, kidney stone, PID  MDM: 26 year old female presents today for evaluation of persistent and worsening abdominal pain, vomiting, diarrhea.  Small amounts of hematemesis noted today.  Will provide symptomatic treatment and evaluate with blood work and provide 2 L fluid bolus.  Given she has CT scan will await lab work before considering repeat imaging. CBC shows leukocytosis with left shift.  Patient does report improvement in pain following pain medication.  CT abdomen pelvis ordered.  Renal function preserved.  CMP without acute concerns.  CBC also shows increase in hemoglobin which is consistent with hemoconcentration.  CT abdomen pelvis without any acute findings.  Patient states pain has returned and is requesting additional pain medication.  Although patient denies vaginal discharge, dysuria she is at risk for STI given she is in a non-monogamous relationship and does not use protection.  She has 2 sexual partners.  We will proceed with pelvic exam and STI  testing.  Patient is agreeable to this. Pelvic exam without evidence of vaginal discharge, cervicitis, or cervical motion tenderness.  STI panel collected.  Patient reports currently her pain is a 4/10.  Which is improved from earlier. Patient again reports increase in pain.  Will provide additional dose of pain medication.  Wet prep shows clue cells.  Will provide Flagyl to treat bacterial vaginosis.  Given ongoing symptoms we will discuss with hospitalist.  Discussed with Dr. Alcario Drought of hospitalist service who will accept patient for admission.  Discussed the findings of CT with appreciation of groundglass opacities in lower lung bases and to empirically treat for pneumonia however given she is without symptomatic's we decided to defer this at this time.    Additional history obtained: -Additional history obtained  from recent emergency room visit. -External records from outside source obtained and reviewed including: Chart review including previous notes, labs, imaging, consultation notes   Lab Tests: -I ordered, reviewed, and interpreted labs.   The pertinent results include:   Labs Reviewed  COMPREHENSIVE METABOLIC PANEL - Abnormal; Notable for the following components:      Result Value   Chloride 112 (*)    CO2 19 (*)    Glucose, Bld 119 (*)    All other components within normal limits  URINALYSIS, ROUTINE W REFLEX MICROSCOPIC - Abnormal; Notable for the following components:   Ketones, ur 15 (*)    All other components within normal limits  CBC WITH DIFFERENTIAL/PLATELET - Abnormal; Notable for the following components:   WBC 14.6 (*)    Hemoglobin 15.4 (*)    Neutro Abs 13.6 (*)    Lymphs Abs 0.5 (*)    All other components within normal limits  WET PREP, GENITAL  LIPASE, BLOOD  PREGNANCY, URINE  GC/CHLAMYDIA PROBE AMP ([place]) NOT AT [facility]      EKG  EKG Interpretation  Date/Time:    Ventricular Rate:    PR Interval:    QRS Duration:   QT Interval:    QTC  Calculation:   R Axis:     Text Interpretation:           Imaging Studies ordered: I ordered imaging studies including CT abdomen pelvis I independently visualized and interpreted imaging. I agree with the radiologist interpretation   Medicines ordered and prescription drug management: Meds ordered this encounter  Medications   HYDROmorphone (DILAUDID) injection 1 mg   ondansetron (ZOFRAN) injection 4 mg   lactated ringers bolus 2,000 mL   iohexol (OMNIPAQUE) 300 MG/ML solution 100 mL   HYDROmorphone (DILAUDID) injection 1 mg    -I have reviewed the patients home medicines and have made adjustments as needed  Reevaluation: After the interventions noted above, I reevaluated the patient and found that they have :stayed the same  Co morbidities that complicate the patient evaluation  Past Medical History:  Diagnosis Date   Anxiety    Asthma    COPD (chronic obstructive pulmonary disease) ([place])       Dispostion: Discussed with hospitalist will evaluate patient for admission.   Final Clinical Impression(s) / ED Diagnoses Final diagnoses:  Generalized abdominal pain  Bacterial vaginosis    Rx / DC Orders ED Discharge Orders     None         Evlyn Courier, PA-C 01/31/22 2312    Wyvonnia Dusky, MD 02/01/22 1119

## 2022-02-01 ENCOUNTER — Encounter (HOSPITAL_COMMUNITY): Payer: Self-pay | Admitting: Internal Medicine

## 2022-02-01 ENCOUNTER — Other Ambulatory Visit: Payer: Self-pay

## 2022-02-01 DIAGNOSIS — F419 Anxiety disorder, unspecified: Secondary | ICD-10-CM | POA: Diagnosis present

## 2022-02-01 DIAGNOSIS — R112 Nausea with vomiting, unspecified: Secondary | ICD-10-CM

## 2022-02-01 DIAGNOSIS — E876 Hypokalemia: Secondary | ICD-10-CM | POA: Diagnosis present

## 2022-02-01 DIAGNOSIS — Z6837 Body mass index (BMI) 37.0-37.9, adult: Secondary | ICD-10-CM | POA: Diagnosis not present

## 2022-02-01 DIAGNOSIS — J4520 Mild intermittent asthma, uncomplicated: Secondary | ICD-10-CM | POA: Diagnosis not present

## 2022-02-01 DIAGNOSIS — Z7951 Long term (current) use of inhaled steroids: Secondary | ICD-10-CM | POA: Diagnosis not present

## 2022-02-01 DIAGNOSIS — E669 Obesity, unspecified: Secondary | ICD-10-CM | POA: Diagnosis present

## 2022-02-01 DIAGNOSIS — F1210 Cannabis abuse, uncomplicated: Secondary | ICD-10-CM | POA: Diagnosis present

## 2022-02-01 DIAGNOSIS — F17210 Nicotine dependence, cigarettes, uncomplicated: Secondary | ICD-10-CM | POA: Diagnosis present

## 2022-02-01 DIAGNOSIS — R1084 Generalized abdominal pain: Secondary | ICD-10-CM | POA: Diagnosis present

## 2022-02-01 DIAGNOSIS — A0811 Acute gastroenteropathy due to Norwalk agent: Secondary | ICD-10-CM | POA: Diagnosis present

## 2022-02-01 DIAGNOSIS — J441 Chronic obstructive pulmonary disease with (acute) exacerbation: Secondary | ICD-10-CM | POA: Diagnosis present

## 2022-02-01 DIAGNOSIS — Z79899 Other long term (current) drug therapy: Secondary | ICD-10-CM | POA: Diagnosis not present

## 2022-02-01 DIAGNOSIS — N760 Acute vaginitis: Secondary | ICD-10-CM | POA: Diagnosis present

## 2022-02-01 DIAGNOSIS — B9689 Other specified bacterial agents as the cause of diseases classified elsewhere: Secondary | ICD-10-CM | POA: Diagnosis not present

## 2022-02-01 LAB — BLOOD GAS, VENOUS
Acid-base deficit: 0.5 mmol/L (ref 0.0–2.0)
Bicarbonate: 24.2 mmol/L (ref 20.0–28.0)
O2 Saturation: 95.7 %
Patient temperature: 37.1
pCO2, Ven: 39 mmHg — ABNORMAL LOW (ref 44–60)
pH, Ven: 7.4 (ref 7.25–7.43)
pO2, Ven: 71 mmHg — ABNORMAL HIGH (ref 32–45)

## 2022-02-01 LAB — LACTIC ACID, PLASMA
Lactic Acid, Venous: 0.7 mmol/L (ref 0.5–1.9)
Lactic Acid, Venous: 0.6 mmol/L (ref 0.5–1.9)

## 2022-02-01 LAB — COMPREHENSIVE METABOLIC PANEL
ALT: 17 U/L (ref 0–44)
AST: 13 U/L — ABNORMAL LOW (ref 15–41)
Albumin: 3.5 g/dL (ref 3.5–5.0)
Alkaline Phosphatase: 71 U/L (ref 38–126)
Anion gap: 7 (ref 5–15)
BUN: 10 mg/dL (ref 6–20)
CO2: 22 mmol/L (ref 22–32)
Calcium: 8.7 mg/dL — ABNORMAL LOW (ref 8.9–10.3)
Chloride: 108 mmol/L (ref 98–111)
Creatinine, Ser: 0.59 mg/dL (ref 0.44–1.00)
GFR, Estimated: >60 mL/min (ref >60)
Glucose, Bld: 98 mg/dL (ref 70–99)
Potassium: 3.5 mmol/L (ref 3.5–5.1)
Sodium: 137 mmol/L (ref 135–145)
Total Bilirubin: 0.6 mg/dL (ref 0.3–1.2)
Total Protein: 6.3 g/dL — ABNORMAL LOW (ref 6.5–8.1)

## 2022-02-01 LAB — CBC
HCT: 42.5 % (ref 36.0–46.0)
Hemoglobin: 14.2 g/dL (ref 12.0–15.0)
MCH: 30.7 pg (ref 26.0–34.0)
MCHC: 33.4 g/dL (ref 30.0–36.0)
MCV: 91.8 fL (ref 80.0–100.0)
Platelets: 193 10*3/uL (ref 150–400)
RBC: 4.63 MIL/uL (ref 3.87–5.11)
RDW: 13.5 % (ref 11.5–15.5)
WBC: 7.6 10*3/uL (ref 4.0–10.5)
nRBC: 0 % (ref 0.0–0.2)

## 2022-02-01 LAB — HEMOGLOBIN AND HEMATOCRIT, BLOOD
HCT: 43.4 % (ref 36.0–46.0)
HCT: 44.5 % (ref 36.0–46.0)
HCT: 42.6 % (ref 36.0–46.0)
Hemoglobin: 14.4 g/dL (ref 12.0–15.0)
Hemoglobin: 14.5 g/dL (ref 12.0–15.0)
Hemoglobin: 13.9 g/dL (ref 12.0–15.0)

## 2022-02-01 LAB — BASIC METABOLIC PANEL
Anion gap: 8 (ref 5–15)
BUN: 10 mg/dL (ref 6–20)
CO2: 22 mmol/L (ref 22–32)
Calcium: 8.9 mg/dL (ref 8.9–10.3)
Chloride: 107 mmol/L (ref 98–111)
Creatinine, Ser: 0.64 mg/dL (ref 0.44–1.00)
GFR, Estimated: >60 mL/min (ref >60)
Glucose, Bld: 111 mg/dL — ABNORMAL HIGH (ref 70–99)
Potassium: 3.4 mmol/L — ABNORMAL LOW (ref 3.5–5.1)
Sodium: 137 mmol/L (ref 135–145)

## 2022-02-01 LAB — PROCALCITONIN: Procalcitonin: <0.1 ng/mL

## 2022-02-01 LAB — SEDIMENTATION RATE: Sed Rate: 5 mm/hr (ref 0–22)

## 2022-02-01 LAB — RAPID URINE DRUG SCREEN, HOSP PERFORMED
Amphetamines: NOT DETECTED
Barbiturates: NOT DETECTED
Benzodiazepines: NOT DETECTED
Cocaine: NOT DETECTED
Opiates: POSITIVE — AB
Tetrahydrocannabinol: POSITIVE — AB

## 2022-02-01 LAB — LIPASE, BLOOD: Lipase: 31 U/L (ref 11–51)

## 2022-02-01 LAB — HIV ANTIBODY (ROUTINE TESTING W REFLEX): HIV Screen 4th Generation wRfx: NONREACTIVE

## 2022-02-01 LAB — C-REACTIVE PROTEIN: CRP: 3.9 mg/dL — ABNORMAL HIGH (ref <1.0)

## 2022-02-01 LAB — MAGNESIUM: Magnesium: 1.7 mg/dL (ref 1.7–2.4)

## 2022-02-01 LAB — ETHANOL: Alcohol, Ethyl (B): <10 mg/dL (ref <10)

## 2022-02-01 MED ORDER — ONDANSETRON HCL 4 MG/2ML IJ SOLN
4.0000 mg | Freq: Four times a day (QID) | INTRAMUSCULAR | Status: DC | PRN
  Administered 2022-02-01 – 2022-02-03 (×5): 4 mg via INTRAVENOUS
  Filled 2022-02-01 (×6): qty 2

## 2022-02-01 MED ORDER — PANTOPRAZOLE SODIUM 40 MG IV SOLR
40.0000 mg | Freq: Two times a day (BID) | INTRAVENOUS | Status: DC
  Administered 2022-02-01 – 2022-02-04 (×7): 40 mg via INTRAVENOUS
  Filled 2022-02-01 (×7): qty 10

## 2022-02-01 MED ORDER — ENOXAPARIN SODIUM 40 MG/0.4ML IJ SOSY
40.0000 mg | PREFILLED_SYRINGE | Freq: Every day | INTRAMUSCULAR | Status: DC
  Administered 2022-02-01 – 2022-02-03 (×3): 40 mg via SUBCUTANEOUS
  Filled 2022-02-01 (×3): qty 0.4

## 2022-02-01 MED ORDER — LORAZEPAM 2 MG/ML IJ SOLN
0.5000 mg | Freq: Three times a day (TID) | INTRAMUSCULAR | Status: DC | PRN
  Administered 2022-02-01: 0.5 mg via INTRAVENOUS
  Filled 2022-02-01: qty 1

## 2022-02-01 MED ORDER — SUCRALFATE 1 GM/10ML PO SUSP
1.0000 g | Freq: Three times a day (TID) | ORAL | Status: DC
  Administered 2022-02-02 – 2022-02-04 (×9): 1 g via ORAL
  Filled 2022-02-01 (×9): qty 10

## 2022-02-01 MED ORDER — METRONIDAZOLE 500 MG/100ML IV SOLN
500.0000 mg | Freq: Two times a day (BID) | INTRAVENOUS | Status: DC
  Administered 2022-02-01 – 2022-02-02 (×4): 500 mg via INTRAVENOUS
  Filled 2022-02-01 (×4): qty 100

## 2022-02-01 MED ORDER — MOMETASONE FURO-FORMOTEROL FUM 200-5 MCG/ACT IN AERO
2.0000 | INHALATION_SPRAY | Freq: Two times a day (BID) | RESPIRATORY_TRACT | Status: DC
  Administered 2022-02-01 – 2022-02-04 (×6): 2 via RESPIRATORY_TRACT
  Filled 2022-02-01: qty 8.8

## 2022-02-01 MED ORDER — ONDANSETRON HCL 4 MG PO TABS: 4.0000 mg | ORAL_TABLET | Freq: Four times a day (QID) | ORAL | Status: DC | PRN

## 2022-02-01 MED ORDER — ALBUTEROL SULFATE HFA 108 (90 BASE) MCG/ACT IN AERS: 1.0000 | INHALATION_SPRAY | Freq: Four times a day (QID) | RESPIRATORY_TRACT | Status: DC | PRN

## 2022-02-01 MED ORDER — SODIUM CHLORIDE 0.9 % IV SOLN: INTRAVENOUS | Status: AC

## 2022-02-01 MED ORDER — MAGNESIUM SULFATE 2 GM/50ML IV SOLN
2.0000 g | Freq: Once | INTRAVENOUS | Status: AC
  Administered 2022-02-01: 2 g via INTRAVENOUS
  Filled 2022-02-01: qty 50

## 2022-02-01 MED ORDER — ONDANSETRON HCL 4 MG/2ML IJ SOLN: 4.0000 mg | Freq: Four times a day (QID) | INTRAMUSCULAR | Status: DC | PRN

## 2022-02-01 MED ORDER — ALBUTEROL SULFATE (2.5 MG/3ML) 0.083% IN NEBU: 2.5000 mg | INHALATION_SOLUTION | RESPIRATORY_TRACT | Status: DC | PRN

## 2022-02-01 MED ORDER — POTASSIUM CHLORIDE 10 MEQ/100ML IV SOLN
10.0000 meq | INTRAVENOUS | Status: AC
  Administered 2022-02-01 (×3): 10 meq via INTRAVENOUS
  Filled 2022-02-01 (×3): qty 100

## 2022-02-01 MED ORDER — DICYCLOMINE HCL 10 MG/ML IM SOLN
20.0000 mg | Freq: Three times a day (TID) | INTRAMUSCULAR | Status: DC | PRN
Start: 2022-02-01 — End: 2022-02-04

## 2022-02-01 MED ORDER — SODIUM CHLORIDE 0.9% FLUSH
3.0000 mL | Freq: Two times a day (BID) | INTRAVENOUS | Status: DC
  Administered 2022-02-01 – 2022-02-04 (×7): 3 mL via INTRAVENOUS

## 2022-02-01 NOTE — H&P (Addendum)
History and Physical    Kathryn Wiley [id number] DOB: [birth year] DOA: 01/31/2022  PCP: Darrol Jump, NP  [facility]  I have personally briefly reviewed patient's old medical records in [place]  Chief Complaint: abdominal pain n/v acutely started 4 days ago  HPI: Kathryn Wiley is a 26 y.o. female with medical history significant of  Anxiety, Asthma/COPD , family history significant for Chron's  disease,as well as iBS,who presents for the second time in 4 days to Ed with recurrent n/v and abdominal pain. On initial evaluation patient was noted to have unremarkable  CT abd/pelvis and was discharged  home with antiemetic . Patient now returns 2 days later with recurrent of symptoms and in ability to tolerate po. Patient had repeat CT abdomen /pelvis which was again noted to be unremarkable. It was noted in ED that despite treatment with dilaudid IV and antiemetic patient continued to have refractory symptoms. Patient also noted that due to violent retching she noted flecks of blood in her emesis with last episode of this occurring in ED. She also notes intermittent loose stools that are light green w/o any noted blood or black stools. She also endorses chills and sweat intermittently. She denies recent travel , tainted foods or sick contacts.  She notes she has  had one prior episode of abdominal pain for which she presented to ED one year ago but s/p treatment in the ER those symptoms resolved. She denies any vaginal discharge or dysuria.  ED Course:  Afeb, bp 110/65, hr 70, rr 20, sat 96% on ra  Labs:  Wbc  14.6, hgb  15.4 increase pmn Lipase 22 NA:139,bicarb 19 ( 24), cr 0.71 Glu 119 AST 17, ALT 22 Zofran 48m, dilaudid : 117miv x 1 LR 2L, dialudid, metronidazole tab 500 x 1 Wet prep, + clue cells Cxr; IMPRESSION: No acute cardiopulmonary process.  CT abdomen: No acute intra-abdominal pathology identified.   Minimal left nonobstructing nephrolithiasis.  No urolithiasis. No hydronephrosis.     Review of Systems: As per HPI otherwise 10 point review of systems negative.   Past Medical History:  Diagnosis Date   Anxiety    Asthma    [place]    History reviewed. No pertinent surgical history.   reports that she has quit smoking. Her smoking use included cigarettes. She has never used smokeless tobacco. She reports current alcohol use. She reports current drug use. Drug: Marijuana.  No Known Allergies  FH Mom, Aunt, GM BRCA Aunt , Ovarian  Sister/Brother /mother:chron's disease  Prior to Admission medications   Medication Sig Start Date End Date Taking? Authorizing Provider  benzoyl peroxide-erythromycin (BENZAMYCIN) gel Apply topically 2 (two) times daily. 10/17/21   StLandis MartinsDPM  buPROPion ER (WELLBUTRIN SR) 100 MG 12 hr tablet Take 100 mg by mouth every morning. 09/01/21   [provider]  promethazine (PHENERGAN) 25 MG suppository Place 1 suppository (25 mg total) rectally every 6 (six) hours as needed for nausea or vomiting. 01/29/22   Redwine, Madison A, PA-C  SYMBICORT 160-4.5 MCG/ACT inhaler Inhale into the lungs. 07/02/21   [provider]  valACYclovir (VALTREX) 500 MG tablet Take 500 mg by mouth daily. 09/15/21   [provider]  VENTOLIN HFA 108 (90 Base) MCG/ACT inhaler Inhale into the lungs. 07/01/21   [provider]    Physical Exam: Vitals:   01/31/22 2330 02/01/22 0050 02/01/22 0051 02/01/22 0057  BP: 117/77  127/79   Pulse: 78  91   Resp:   18   Temp:   98.8 F (37.1 C)   TempSrc:   Oral   SpO2: 95%  98%   Weight:    95 kg  Height:  5' 3" (1.6 m)       Vitals:   01/31/22 2330 02/01/22 0050 02/01/22 0051 02/01/22 0057  BP: 117/77  127/79   Pulse: 78  91   Resp:   18   Temp:   98.8 F (37.1 C)   TempSrc:   Oral   SpO2: 95%  98%   Weight:    95 kg  Height:  5' 3" (1.6 m)    Constitutional: NAD, calm,  comfortable Eyes: PERRL, lids and conjunctivae normal ENMT: Mucous membranes are moist. Posterior pharynx clear of any exudate or lesions.Normal dentition.  Neck: normal, supple, no masses, no thyromegaly Respiratory: clear to auscultation bilaterally, no wheezing, no crackles. Normal respiratory effort. No accessory muscle use.  Cardiovascular: Regular rate and rhythm, no murmurs / rubs / gallops. No extremity edema. 2+ pedal pulses. No carotid bruits.  Abdomen:  tenderness with deep palpation epigastric area as well as left lower quadrant, no rebound, no masses palpated. No hepatosplenomegaly. Bowel sounds positive.  Musculoskeletal: no clubbing / cyanosis. No joint deformity upper and lower extremities. Good ROM, no contractures. Normal muscle tone.  Skin: no rashes, lesions, ulcers. No induration Neurologic: CN 2-12 grossly intact. Sensation intact,. Strength 5/5 in all 4.  Psychiatric: Normal judgment and insight. Alert and oriented x 3. Normal mood.    Labs on Admission: I have personally reviewed following labs and imaging studies  CBC: Recent Labs  Lab 01/29/22 1519 01/31/22 1715  WBC 8.1 14.6*  NEUTROABS 4.7 13.6*  HGB 14.8 15.4*  HCT 43.7 44.7  MCV 90.9 87.8  PLT 253 962   Basic Metabolic Panel: Recent Labs  Lab 01/29/22 1519 01/31/22 1715  NA 138 139  K 4.0 3.8  CL 110 112*  CO2 24 19*  GLUCOSE 89 119*  BUN 11 14  CREATININE 0.66 0.71  CALCIUM 8.9 9.3   GFR: Estimated Creatinine Clearance: 115.7 mL/min (by C-G formula based on SCr of 0.71 mg/dL). Liver Function Tests: Recent Labs  Lab 01/29/22 1519 01/31/22 1715  AST 15 17  ALT 20 22  ALKPHOS 75 85  BILITOT 0.7 0.9  PROT 6.9 7.5  ALBUMIN 3.8 4.3   Recent Labs  Lab 01/29/22 1519 01/31/22 1715  LIPASE 24 22   No results for input(s): "AMMONIA" in the last 168 hours. Coagulation Profile: No results for input(s): "INR", "PROTIME" in the last 168 hours. Cardiac Enzymes: No results for input(s):  "CKTOTAL", "CKMB", "CKMBINDEX", "TROPONINI" in the last 168 hours. BNP (last 3 results) No results for input(s): "PROBNP" in the last 8760 hours. HbA1C: No results for input(s): "HGBA1C" in the last 72 hours. CBG: No results for input(s): "GLUCAP" in the last 168 hours. Lipid Profile: No results for input(s): "CHOL", "HDL", "LDLCALC", "TRIG", "CHOLHDL", "LDLDIRECT" in the last 72 hours. Thyroid Function Tests: No results for input(s): "TSH", "T4TOTAL", "FREET4", "T3FREE", "THYROIDAB" in the last 72 hours. Anemia Panel: No results for input(s): "VITAMINB12", "FOLATE", "FERRITIN", "TIBC", "IRON", "RETICCTPCT" in the last 72 hours. Urine analysis:    Component Value Date/Time   COLORURINE YELLOW 01/31/2022 [place] 01/31/2022 1605   LABSPEC 1.025 01/31/2022 1605   PHURINE 5.5 01/31/2022 1605   GLUCOSEU NEGATIVE 01/31/2022 1605   HGBUR NEGATIVE  01/31/2022 [place] 01/31/2022 1605   KETONESUR 15 (A) 01/31/2022 1605   PROTEINUR NEGATIVE 01/31/2022 1605   NITRITE NEGATIVE 01/31/2022 1605   LEUKOCYTESUR NEGATIVE 01/31/2022 1605    Radiological Exams on Admission: CT ABDOMEN PELVIS W CONTRAST  Result Date: 01/31/2022 CLINICAL DATA:  Abdominal pain, acute, nonlocalized. Nausea, vomiting, diarrhea. EXAM: CT ABDOMEN AND PELVIS WITH CONTRAST TECHNIQUE: Multidetector CT imaging of the abdomen and pelvis was performed using the standard protocol following bolus administration of intravenous contrast. RADIATION DOSE REDUCTION: This exam was performed according to the departmental dose-optimization program which includes automated exposure control, adjustment of the mA and/or kV according to patient size and/or use of iterative reconstruction technique. CONTRAST:  138m OMNIPAQUE IOHEXOL 300 MG/ML  SOLN COMPARISON:  01/29/2022 FINDINGS: Lower chest: Wispy ground-glass opacity at the right lung base is again noted, nonspecific, atelectasis versus infiltrate.  Visualized heart and pericardium are unremarkable. Hepatobiliary: No focal liver abnormality is seen. No gallstones, gallbladder wall thickening, or biliary dilatation. Pancreas: Unremarkable Spleen: Unremarkable Adrenals/Urinary Tract: The adrenal glands are unremarkable. The kidneys are normal in size and position. 2 mm nonobstructing calculus is seen within the lower pole of the left kidney. No hydronephrosis. No urolithiasis. The kidneys are otherwise unremarkable. The bladder is unremarkable. Stomach/Bowel: Stomach is within normal limits. Appendix appears normal. No evidence of bowel wall thickening, distention, or inflammatory changes. No free intraperitoneal gas or fluid. Vascular/Lymphatic: No significant vascular findings are present. No enlarged abdominal or pelvic lymph nodes. Reproductive: Intrauterine device in expected position. The pelvic organs are otherwise unremarkable. Other: No abdominal wall hernia. Musculoskeletal: Remote left eleventh rib fracture again identified. No acute bone abnormality. IMPRESSION: No acute intra-abdominal pathology identified. Minimal left nonobstructing nephrolithiasis. No urolithiasis. No hydronephrosis. Electronically Signed   By: AFidela SalisburyM.D.   On: 01/31/2022 18:52   DG Chest 2 View  Result Date: 01/31/2022 CLINICAL DATA:  Follow-up ground-glass densities EXAM: CHEST - 2 VIEW COMPARISON:  None Available. FINDINGS: Normal mediastinum and cardiac silhouette. Normal pulmonary vasculature. No evidence of effusion, infiltrate, or pneumothorax. No acute bony abnormality. IMPRESSION: No acute cardiopulmonary process. Electronically Signed   By: SSuzy BouchardM.D.   On: 01/31/2022 18:24    EKG: Independently reviewed. N/a Assessment/Plan  Intractable nausea and Vomiting  Acute gastroenteritis nos  -admit to tele  -supportive care bentyl/ antiemetic  -of note CT abd /pelvis NAD  -check stool studies /lactic /inflammatory markers  -check USD r/o  marijuana induced emesis -gi consult for further assistance Dr MSarina Ser Hematemesis -last episode close to 12 hours ago -noted stable h/h -monitor serial h/h -protonix iv bid and carafate as tolerated   Bacterial Vaginosis -clue cells noted on thin prep  -metronidazole  -STD screen pending    Hx of Anxiety  -resume chronic medication   Leukocytosis -thought to be related to stress response -no fever  -ua negative  - ct ? Nonspecific finding ? Early infiltrate atelectasis  -monitor fever curve -check inflammatory markers ,fecal calprotectin  -no abx currently   Asthma/COPD  -no current flare  -prn nebs  -resume inhalers   DVT prophylaxis: scd Code Status: full  Family Communication:  none at beside Disposition Plan: patient  expected to be admitted < than 2 midnights  Consults called: n/a Admission status: tele   SClance BollMD [facility]   If 7PM-7AM, please contact night-coverage [email or web address] Password [email or web address]  02/01/2022, 1:29 AM

## 2022-02-01 NOTE — Consult Note (Signed)
Reason for Consult: Nausea vomiting abdominal pain Referring Physician: Hospital team  Kathryn Wiley is an 26 y.o. female.  HPI: Patient seen and examined in her hospital computer chart reviewed and her case discussed with her husband as well and she has had about a 4 to 5-day history of nausea vomiting abdominal pain and diarrhea and it seems to have started with nausea vomiting and her only new medicine is Lexapro started a little over a month ago and she says she smokes marijuana to help her appetite and she does vape which we discussed and she only had a fleck or 2 of blood in some of her vomitus and has not seen any blood in her bowels and has no sick contacts no at risk meals no new herbs or vitamins or other significant travel history and no sick contacts and although she may have some night sweats no fever or chills or obvious skin lesions joint issues or eye complaints and we answered all of their questions and her family history is pertinent for some Crohn's in the family but she has never had a previous GI work-up and her pain does seem to move around from the left side to the right side but seems to be more middle and lower than upper and she has no other specific complaints  Past Medical History:  Diagnosis Date   Anxiety    Asthma    COPD (chronic obstructive pulmonary disease) (HCC)     History reviewed. No pertinent surgical history.  History reviewed. No pertinent family history.  Social History:  reports that she has quit smoking. Her smoking use included cigarettes. She has never used smokeless tobacco. She reports current alcohol use. She reports current drug use. Drug: Marijuana.  Allergies: No Known Allergies  Medications: I have reviewed the patient's current medications.  Results for orders placed or performed during the hospital encounter of 01/31/22 (from the past 48 hour(s))  Urinalysis, Routine w reflex microscopic Urine, Clean Catch     Status: Abnormal    Collection Time: 01/31/22  4:05 PM  Result Value Ref Range   Color, Urine YELLOW YELLOW   APPearance CLEAR CLEAR   Specific Gravity, Urine 1.025 1.005 - 1.030   pH 5.5 5.0 - 8.0   Glucose, UA NEGATIVE NEGATIVE mg/dL   Hgb urine dipstick NEGATIVE NEGATIVE   Bilirubin Urine NEGATIVE NEGATIVE   Ketones, ur 15 (A) NEGATIVE mg/dL   Protein, ur NEGATIVE NEGATIVE mg/dL   Nitrite NEGATIVE NEGATIVE   Leukocytes,Ua NEGATIVE NEGATIVE    Comment: Microscopic not done on urines with negative protein, blood, leukocytes, nitrite, or glucose < 500 mg/dL. Performed at [facility], [address] [place], [place] [address]   Pregnancy, urine     Status: None   Collection Time: 01/31/22  4:05 PM  Result Value Ref Range   Preg Test, Ur NEGATIVE NEGATIVE    Comment:        THE SENSITIVITY OF THIS METHODOLOGY IS >20 mIU/mL. Performed at [facility], [address] [place], [place] [address]   Lipase, blood     Status: None   Collection Time: 01/31/22  5:15 PM  Result Value Ref Range   Lipase 22 11 - 51 U/L    Comment: Performed at [facility], [address] [place], [place] [address]  Comprehensive metabolic panel     Status: Abnormal   Collection Time: 01/31/22  5:15 PM  Result Value Ref  Range   Sodium 139 135 - 145 mmol/L   Potassium 3.8 3.5 - 5.1 mmol/L   Chloride 112 (H) 98 - 111 mmol/L   CO2 19 (L) 22 - 32 mmol/L   Glucose, Bld 119 (H) 70 - 99 mg/dL    Comment: Glucose reference range applies only to samples taken after fasting for at least 8 hours.   BUN 14 6 - 20 mg/dL   Creatinine, Ser 1.61 0.44 - 1.00 mg/dL   Calcium 9.3 8.9 - 09.6 mg/dL   Total Protein 7.5 6.5 - 8.1 g/dL   Albumin 4.3 3.5 - 5.0 g/dL   AST 17 15 - 41 U/L   ALT 22 0 - 44 U/L   Alkaline Phosphatase 85 38 - 126 U/L   Total Bilirubin 0.9 0.3 - 1.2 mg/dL   GFR, Estimated >04 >54 mL/min    Comment: (NOTE) Calculated using the CKD-EPI Creatinine Equation (2021)    Anion gap 8 5  - 15    Comment: Performed at [facility], [address] [place], [place] [address]  CBC with Differential     Status: Abnormal   Collection Time: 01/31/22  5:15 PM  Result Value Ref Range   WBC 14.6 (H) 4.0 - 10.5 K/uL   RBC 5.09 3.87 - 5.11 MIL/uL   Hemoglobin 15.4 (H) 12.0 - 15.0 g/dL   HCT 91.4 78.2 - 95.6 %   MCV 87.8 80.0 - 100.0 fL   MCH 30.3 26.0 - 34.0 pg   MCHC 34.5 30.0 - 36.0 g/dL   RDW 21.3 08.6 - 57.8 %   Platelets 260 150 - 400 K/uL   nRBC 0.0 0.0 - 0.2 %   Neutrophils Relative % 94 %   Neutro Abs 13.6 (H) 1.7 - 7.7 K/uL   Lymphocytes Relative 3 %   Lymphs Abs 0.5 (L) 0.7 - 4.0 K/uL   Monocytes Relative 3 %   Monocytes Absolute 0.4 0.1 - 1.0 K/uL   Eosinophils Relative 0 %   Eosinophils Absolute 0.0 0.0 - 0.5 K/uL   Basophils Relative 0 %   Basophils Absolute 0.0 0.0 - 0.1 K/uL   Immature Granulocytes 0 %   Abs Immature Granulocytes 0.05 0.00 - 0.07 K/uL    Comment: Performed at [facility], [address] [place], [place] [address]  Wet prep, genital     Status: Abnormal   Collection Time: 01/31/22  8:58 PM  Result Value Ref Range   Yeast Wet Prep HPF POC NONE SEEN NONE SEEN   Trich, Wet Prep NONE SEEN NONE SEEN   Clue Cells Wet Prep HPF POC PRESENT (A) NONE SEEN   WBC, Wet Prep HPF POC <10 <10   Sperm NONE SEEN     Comment: Performed at [facility], [address] [place], [place] [address]  Hemoglobin and hematocrit, blood     Status: None   Collection Time: 02/01/22  2:43 AM  Result Value Ref Range   Hemoglobin 14.4 12.0 - 15.0 g/dL   HCT 13.2 44.0 - 10.2 %    Comment: Performed at [facility], [address] [place], [place] [address]  Procalcitonin - Baseline     Status: None   Collection Time: 02/01/22  2:43 AM  Result Value Ref Range   Procalcitonin <0.10 ng/mL    Comment:        Interpretation: PCT (Procalcitonin) <= 0.5 ng/mL: Systemic infection (sepsis) is not likely.  Local  bacterial infection is possible. (NOTE)       Sepsis PCT Algorithm           Lower Respiratory Tract                                      Infection PCT Algorithm    ----------------------------     ----------------------------         PCT < 0.25 ng/mL                PCT < 0.10 ng/mL          Strongly encourage             Strongly discourage   discontinuation of antibiotics    initiation of antibiotics    ----------------------------     -----------------------------       PCT 0.25 - 0.50 ng/mL            PCT 0.10 - 0.25 ng/mL               OR       >80% decrease in PCT            Discourage initiation of                                            antibiotics      Encourage discontinuation           of antibiotics    ----------------------------     -----------------------------         PCT >= 0.50 ng/mL              PCT 0.26 - 0.50 ng/mL               AND        <80% decrease in PCT             Encourage initiation of                                             antibiotics       Encourage continuation           of antibiotics    ----------------------------     -----------------------------        PCT >= 0.50 ng/mL                  PCT > 0.50 ng/mL               AND         increase in PCT                  Strongly encourage                                      initiation of antibiotics    Strongly encourage escalation           of antibiotics                                     -----------------------------  PCT <= 0.25 ng/mL                                                 OR                                        > 80% decrease in PCT                                      Discontinue / Do not initiate                                             antibiotics  Performed at [facility], [address] [place], [place] [address]   Sedimentation rate     Status: None   Collection Time: 02/01/22  2:43 AM  Result Value  Ref Range   Sed Rate 5 0 - 22 mm/hr    Comment: Performed at [facility], [address], [place], [place] [address]  Ethanol     Status: None   Collection Time: 02/01/22  2:43 AM  Result Value Ref Range   Alcohol, Ethyl (B) <10 <10 mg/dL    Comment: (NOTE) Lowest detectable limit for serum alcohol is 10 mg/dL.  For medical purposes only. Performed at [facility], [address], [place], [place] [address]   Blood gas, venous     Status: Abnormal   Collection Time: 02/01/22  2:43 AM  Result Value Ref Range   pH, Ven 7.4 7.25 - 7.43   pCO2, Ven 39 (L) 44 - 60 mmHg   pO2, Ven 71 (H) 32 - 45 mmHg   Bicarbonate 24.2 20.0 - 28.0 mmol/L   Acid-base deficit 0.5 0.0 - 2.0 mmol/L   O2 Saturation 95.7 %   Patient temperature 37.1     Comment: Performed at [facility], [address], [place], [place] [address]  Basic metabolic panel     Status: Abnormal   Collection Time: 02/01/22  2:43 AM  Result Value Ref Range   Sodium 137 135 - 145 mmol/L   Potassium 3.4 (L) 3.5 - 5.1 mmol/L   Chloride 107 98 - 111 mmol/L   CO2 22 22 - 32 mmol/L   Glucose, Bld 111 (H) 70 - 99 mg/dL    Comment: Glucose reference range applies only to samples taken after fasting for at least 8 hours.   BUN 10 6 - 20 mg/dL   Creatinine, Ser 6.57 0.44 - 1.00 mg/dL   Calcium 8.9 8.9 - 84.6 mg/dL   GFR, Estimated >96 >29 mL/min    Comment: (NOTE) Calculated using the CKD-EPI Creatinine Equation (2021)    Anion gap 8 5 - 15    Comment: Performed at [facility], [address] [place], [place] [address]  Magnesium     Status: None   Collection Time: 02/01/22  2:43 AM  Result Value Ref Range   Magnesium 1.7 1.7 - 2.4 mg/dL    Comment: Performed at [facility], [address] [place], [place] [address]  Lactic acid, plasma     Status: None   Collection Time: 02/01/22  2:43 AM  Result Value Ref Range   Lactic Acid,  Venous 0.7 0.5 - 1.9 mmol/L    Comment: Performed at [facility], [address] [place], [place] [address]  Rapid urine drug screen (hospital performed)     Status: Abnormal   Collection Time: 02/01/22  3:00 AM  Result Value Ref Range   Opiates POSITIVE (A) NONE DETECTED   Cocaine NONE DETECTED NONE DETECTED   Benzodiazepines NONE DETECTED NONE DETECTED   Amphetamines NONE DETECTED NONE DETECTED   Tetrahydrocannabinol POSITIVE (A) NONE DETECTED   Barbiturates NONE DETECTED NONE DETECTED    Comment: (NOTE) DRUG SCREEN FOR MEDICAL PURPOSES ONLY.  IF CONFIRMATION IS NEEDED FOR ANY PURPOSE, NOTIFY LAB WITHIN 5 DAYS.  LOWEST DETECTABLE LIMITS FOR URINE DRUG SCREEN Drug Class                     Cutoff (ng/mL) Amphetamine and metabolites    1000 Barbiturate and metabolites    200 Benzodiazepine                 200 Tricyclics and metabolites     300 Opiates and metabolites        300 Cocaine and metabolites        300 THC                            50 Performed at [facility], [address] [place], [place] [address]   Lactic acid, plasma     Status: None   Collection Time: 02/01/22  5:03 AM  Result Value Ref Range   Lactic Acid, Venous 0.6 0.5 - 1.9 mmol/L    Comment: Performed at [facility], [address] [place], [place] [address]  CBC     Status: None   Collection Time: 02/01/22  5:03 AM  Result Value Ref Range   WBC 7.6 4.0 - 10.5 K/uL   RBC 4.63 3.87 - 5.11 MIL/uL   Hemoglobin 14.2 12.0 - 15.0 g/dL   HCT 29.542.5 62.136.0 - 30.846.0 %   MCV 91.8 80.0 - 100.0 fL   MCH 30.7 26.0 - 34.0 pg   MCHC 33.4 30.0 - 36.0 g/dL   RDW 65.713.5 84.611.5 - 96.215.5 %   Platelets 193 150 - 400 K/uL   nRBC 0.0 0.0 - 0.2 %    Comment: Performed at [facility], [address] [place], [place] [address]  Comprehensive metabolic panel     Status: Abnormal   Collection Time: 02/01/22  5:03 AM  Result Value Ref Range   Sodium  137 135 - 145 mmol/L   Potassium 3.5 3.5 - 5.1 mmol/L   Chloride 108 98 - 111 mmol/L   CO2 22 22 - 32 mmol/L   Glucose, Bld 98 70 - 99 mg/dL    Comment: Glucose reference range applies only to samples taken after fasting for at least 8 hours.   BUN 10 6 - 20 mg/dL   Creatinine, Ser 1.320.59 0.44 - 1.00 mg/dL   Calcium 8.7 (L) 8.9 - 10.3 mg/dL   Total Protein 6.3 (L) 6.5 - 8.1 g/dL   Albumin 3.5 3.5 - 5.0 g/dL   AST 13 (L) 15 - 41 U/L   ALT 17 0 - 44  U/L   Alkaline Phosphatase 71 38 - 126 U/L   Total Bilirubin 0.6 0.3 - 1.2 mg/dL   GFR, Estimated >53 >29 mL/min    Comment: (NOTE) Calculated using the CKD-EPI Creatinine Equation (2021)    Anion gap 7 5 - 15    Comment: Performed at [facility], [address], [place], [place] [address]  Hemoglobin and hematocrit, blood     Status: None   Collection Time: 02/01/22 10:19 AM  Result Value Ref Range   Hemoglobin 14.5 12.0 - 15.0 g/dL   HCT 83.4 19.6 - 22.2 %    Comment: Performed at [facility], [address], [place], [place] [address]    CT ABDOMEN PELVIS W CONTRAST  Result Date: 01/31/2022 CLINICAL DATA:  Abdominal pain, acute, nonlocalized. Nausea, vomiting, diarrhea. EXAM: CT ABDOMEN AND PELVIS WITH CONTRAST TECHNIQUE: Multidetector CT imaging of the abdomen and pelvis was performed using the standard protocol following bolus administration of intravenous contrast. RADIATION DOSE REDUCTION: This exam was performed according to the departmental dose-optimization program which includes automated exposure control, adjustment of the mA and/or kV according to patient size and/or use of iterative reconstruction technique. CONTRAST:  OMNIPAQUE IOHEXOL 300 MG/ML  SOLN COMPARISON:  01/29/2022 FINDINGS: Lower chest: Wispy ground-glass opacity at the right lung base is again noted, nonspecific, atelectasis versus infiltrate. Visualized heart and pericardium are unremarkable. Hepatobiliary: No focal liver  abnormality is seen. No gallstones, gallbladder wall thickening, or biliary dilatation. Pancreas: Unremarkable Spleen: Unremarkable Adrenals/Urinary Tract: The adrenal glands are unremarkable. The kidneys are normal in size and position. 2 mm nonobstructing calculus is seen within the lower pole of the left kidney. No hydronephrosis. No urolithiasis. The kidneys are otherwise unremarkable. The bladder is unremarkable. Stomach/Bowel: Stomach is within normal limits. Appendix appears normal. No evidence of bowel wall thickening, distention, or inflammatory changes. No free intraperitoneal gas or fluid. Vascular/Lymphatic: No significant vascular findings are present. No enlarged abdominal or pelvic lymph nodes. Reproductive: Intrauterine device in expected position. The pelvic organs are otherwise unremarkable. Other: No abdominal wall hernia. Musculoskeletal: Remote left eleventh rib fracture again identified. No acute bone abnormality. IMPRESSION: No acute intra-abdominal pathology identified. Minimal left nonobstructing nephrolithiasis. No urolithiasis. No hydronephrosis. Electronically Signed   By: Helyn Numbers M.D.   On: 01/31/2022 18:52   DG Chest 2 View  Result Date: 01/31/2022 CLINICAL DATA:  Follow-up ground-glass densities EXAM: CHEST - 2 VIEW COMPARISON:  None Available. FINDINGS: Normal mediastinum and cardiac silhouette. Normal pulmonary vasculature. No evidence of effusion, infiltrate, or pneumothorax. No acute bony abnormality. IMPRESSION: No acute cardiopulmonary process. Electronically Signed   By: Genevive Bi M.D.   On: 01/31/2022 18:24    ROS negative except above Blood pressure (!) 112/59, pulse 71, temperature 97.8 F (36.6 C), temperature source Oral, resp. rate 20, height 5\' 3"  (1.6 m), weight 95 kg, SpO2 94 %. Physical Exam vital signs stable afebrile no acute distress abdomen is soft no guarding or rebound not making her worse with palpation CT and labs normal talk screen  positive for THC and opioids  Assessment/Plan: Multiple GI complaints questionable etiology Plan: Observation only for now okay with sips of clear liquids when she would like we will ask rounding team to check on tomorrow and based on how she is doing decide if any further work-up and plans are needed and hopefully this will be short-lived and please call sooner if any specific GI question or problem  Jamani Bearce E 02/01/2022,  11:54 AM

## 2022-02-01 NOTE — Progress Notes (Signed)
PROGRESS NOTE    Kathryn Wiley  ZOX:[id number] DOB: 07/12/1996 DOA: 01/31/2022 PCP: Kathryn Wiley     Brief Narrative:   26 y.o. WF PMHx Anxiety, Asthma/COPD , family history significant for Chron's  disease,as well as iBS,  Presents for the second time in 4 days to Ed with recurrent n/v and abdominal pain. On initial evaluation patient was noted to have unremarkable  CT abd/pelvis and was discharged  home with antiemetic . Patient now returns 2 days later with recurrent of symptoms and in ability to tolerate po. Patient had repeat CT abdomen /pelvis which was again noted to be unremarkable. It was noted in ED that despite treatment with dilaudid IV and antiemetic patient continued to have refractory symptoms. Patient also noted that due to violent retching she noted flecks of blood in her emesis with last episode of this occurring in ED. She also notes intermittent loose stools that are light green w/o any noted blood or black stools. She also endorses chills and sweat intermittently. She denies recent travel , tainted foods or sick contacts.  She notes she has  had one prior episode of abdominal pain for which she presented to ED one year ago but s/p treatment in the ER those symptoms resolved. She denies any vaginal discharge or dysuria.   Subjective: Patient difficult to arouse sleeping peacefully.  Patient evaluated the morning of 6/11 by [facility] physician no charge   Assessment & Plan: Covid vaccination;   Principal Problem:   Intractable nausea and vomiting  Intractable nausea and Vomiting  Acute gastroenteritis nos  -admit to tele  -supportive care bentyl/ antiemetic  -of note CT abd /pelvis NAD  -check stool studies /lactic /inflammatory markers  -check USD r/o marijuana induced emesis -gi consult for further assistance Dr Kathryn Wiley   Hematemesis -last episode close to 12 hours ago -noted stable h/h -monitor serial h/h -protonix iv bid and carafate as tolerated     Bacterial Vaginosis -clue cells noted on thin prep  -metronidazole  -STD screen pending      Hx of Anxiety  -resume chronic medication    Leukocytosis -thought to be related to stress response -no fever  -ua negative  - ct ? Nonspecific finding ? Early infiltrate atelectasis  -monitor fever curve -check inflammatory markers ,fecal calprotectin  -no abx currently    Asthma/COPD  -no current flare  -prn nebs  -resume inhalers    Obesity 37.1 kg/m.         Mobility Assessment (last 72 hours)     Mobility Assessment     Row Name 02/01/22 0000           Does patient have an order for bedrest or is patient medically unstable No - Continue assessment       What is the highest level of mobility based on the progressive mobility assessment? Level 6 (Walks independently in room and hall) - Balance while walking in room without assist - Complete                  Interdisciplinary Goals of Care Family Meeting   Date carried out: 02/01/2022  Location of the meeting:   Member's involved:   Durable Power of Insurance risk surveyorAttorney or acting medical decision maker:     Discussion: We discussed goals of care for Kathryn Wiley .    Code status:   Disposition:   Time spent for the meeting:     Kathryn DallasWOODS, Kathryn Wiley J, MD  02/01/2022, 8:35 AM  DVT prophylaxis: Lovenox Code Status: Full Family Communication:  Status is: Inpatient    Dispo: The patient is from: Home              Anticipated d/c is to: Home              Anticipated d/c date is: > 3 days              Patient currently is not medically stable to d/c.      Consultants:    Procedures/Significant Events:    I have personally reviewed and interpreted all radiology studies and my findings are as above.  VENTILATOR SETTINGS:    Cultures   Antimicrobials: Anti-infectives (From admission, onward)    Start     Ordered Stop   02/01/22 1100  metroNIDAZOLE (FLAGYL) IVPB 500 mg         02/01/22 0204 02/03/22 2259   01/31/22 2245  metroNIDAZOLE (FLAGYL) tablet 500 mg        01/31/22 2242 01/31/22 2247        Devices    LINES / TUBES:      Continuous Infusions:  sodium chloride 100 mL/hr at 02/01/22 0242   metronidazole     potassium chloride 10 mEq (02/01/22 0828)     Objective: Vitals:   02/01/22 0051 02/01/22 0057 02/01/22 0458 02/01/22 0546  BP: 127/79   118/72  Pulse: 91   73  Resp: 18   18  Temp: 98.8 F (37.1 C)   98.6 F (37 C)  TempSrc: Oral   Oral  SpO2: 98%   94%  Weight:  95 kg 95 kg   Height:        Intake/Output Summary (Last 24 hours) at 02/01/2022 0835 Last data filed at 01/31/2022 1958 Gross per 24 hour  Intake 2000 ml  Output --  Net 2000 ml   Filed Weights   02/01/22 0057 02/01/22 0458  Weight: 95 kg 95 kg    Examination:  Patient examined by [facility] hospitalist 6/11 in the AM no charge .     Data Reviewed: Care during the described time interval was provided by me .  I have reviewed this patient's available data, including medical history, events of note, physical examination, and all test results as part of my evaluation.  CBC: Recent Labs  Lab 01/29/22 1519 01/31/22 1715 02/01/22 0243 02/01/22 0503  WBC 8.1 14.6*  --  7.6  NEUTROABS 4.7 13.6*  --   --   HGB 14.8 15.4* 14.4 14.2  HCT 43.7 44.7 43.4 42.5  MCV 90.9 87.8  --  91.8  PLT 253 260  --  193   Basic Metabolic Panel: Recent Labs  Lab 01/29/22 1519 01/31/22 1715 02/01/22 0243 02/01/22 0503  NA 138 139 137 137  K 4.0 3.8 3.4* 3.5  CL 110 112* 107 108  CO2 24 19* 22 22  GLUCOSE 89 119* 111* 98  BUN 11 14 10 10   CREATININE 0.66 0.71 0.64 0.59  CALCIUM 8.9 9.3 8.9 8.7*  MG  --   --  1.7  --    GFR: Estimated Creatinine Clearance: 115.7 mL/min (by C-G formula based on SCr of 0.59 mg/dL). Liver Function Tests: Recent Labs  Lab 01/29/22 1519 01/31/22 1715 02/01/22 0503  AST 15 17 13*  ALT 20 22 17   ALKPHOS 75 85 71  BILITOT 0.7 0.9 0.6   PROT 6.9 7.5 6.3*  ALBUMIN 3.8 4.3 3.5   Recent Labs  Lab 01/29/22 1519 01/31/22 1715  LIPASE 24 22   No results for input(s): "AMMONIA" in the last 168 hours. Coagulation Profile: No results for input(s): "INR", "PROTIME" in the last 168 hours. Cardiac Enzymes: No results for input(s): "CKTOTAL", "CKMB", "CKMBINDEX", "TROPONINI" in the last 168 hours. BNP (last 3 results) No results for input(s): "PROBNP" in the last 8760 hours. HbA1C: No results for input(s): "HGBA1C" in the last 72 hours. CBG: No results for input(s): "GLUCAP" in the last 168 hours. Lipid Profile: No results for input(s): "CHOL", "HDL", "LDLCALC", "TRIG", "CHOLHDL", "LDLDIRECT" in the last 72 hours. Thyroid Function Tests: No results for input(s): "TSH", "T4TOTAL", "FREET4", "T3FREE", "THYROIDAB" in the last 72 hours. Anemia Panel: No results for input(s): "VITAMINB12", "FOLATE", "FERRITIN", "TIBC", "IRON", "RETICCTPCT" in the last 72 hours. Sepsis Labs: Recent Labs  Lab 02/01/22 0243 02/01/22 0503  PROCALCITON <0.10  --   LATICACIDVEN 0.7 0.6    Recent Results (from the past 240 hour(s))  Wet prep, genital     Status: Abnormal   Collection Time: 01/31/22  8:58 PM  Result Value Ref Range Status   Yeast Wet Prep HPF POC NONE SEEN NONE SEEN Final   Trich, Wet Prep NONE SEEN NONE SEEN Final   Clue Cells Wet Prep HPF POC PRESENT (A) NONE SEEN Final   WBC, Wet Prep HPF POC <10 <10 Final   Sperm NONE SEEN  Final    Comment: Performed at [facility], [address] [place], [place] [address]         Radiology Studies: CT ABDOMEN PELVIS W CONTRAST  Result Date: 01/31/2022 CLINICAL DATA:  Abdominal pain, acute, nonlocalized. Nausea, vomiting, diarrhea. EXAM: CT ABDOMEN AND PELVIS WITH CONTRAST TECHNIQUE: Multidetector CT imaging of the abdomen and pelvis was performed using the standard protocol following bolus administration of intravenous contrast. RADIATION DOSE REDUCTION: This exam  was performed according to the departmental dose-optimization program which includes automated exposure control, adjustment of the mA and/or kV according to patient size and/or use of iterative reconstruction technique. CONTRAST:  OMNIPAQUE IOHEXOL 300 MG/ML  SOLN COMPARISON:  01/29/2022 FINDINGS: Lower chest: Wispy ground-glass opacity at the right lung base is again noted, nonspecific, atelectasis versus infiltrate. Visualized heart and pericardium are unremarkable. Hepatobiliary: No focal liver abnormality is seen. No gallstones, gallbladder wall thickening, or biliary dilatation. Pancreas: Unremarkable Spleen: Unremarkable Adrenals/Urinary Tract: The adrenal glands are unremarkable. The kidneys are normal in size and position. 2 mm nonobstructing calculus is seen within the lower pole of the left kidney. No hydronephrosis. No urolithiasis. The kidneys are otherwise unremarkable. The bladder is unremarkable. Stomach/Bowel: Stomach is within normal limits. Appendix appears normal. No evidence of bowel wall thickening, distention, or inflammatory changes. No free intraperitoneal gas or fluid. Vascular/Lymphatic: No significant vascular findings are present. No enlarged abdominal or pelvic lymph nodes. Reproductive: Intrauterine device in expected position. The pelvic organs are otherwise unremarkable. Other: No abdominal wall hernia. Musculoskeletal: Remote left eleventh rib fracture again identified. No acute bone abnormality. IMPRESSION: No acute intra-abdominal pathology identified. Minimal left nonobstructing nephrolithiasis. No urolithiasis. No hydronephrosis. Electronically Signed   By: Helyn Numbers M.D.   On: 01/31/2022 18:52   DG Chest 2 View  Result Date: 01/31/2022 CLINICAL DATA:  Follow-up ground-glass densities EXAM: CHEST - 2 VIEW COMPARISON:  None Available. FINDINGS: Normal mediastinum and cardiac silhouette. Normal pulmonary vasculature. No evidence of effusion, infiltrate, or  pneumothorax. No acute bony abnormality. IMPRESSION: No acute cardiopulmonary process. Electronically Signed  By: Genevive Bi M.D.   On: 01/31/2022 18:24        Scheduled Meds:  mometasone-formoterol  2 puff Inhalation BID   pantoprazole (PROTONIX) IV  40 mg Intravenous Q12H   sodium chloride flush  3 mL Intravenous Q12H   sucralfate  1 g Oral TID WC & HS   Continuous Infusions:  sodium chloride 100 mL/hr at 02/01/22 0242   metronidazole     potassium chloride 10 mEq (02/01/22 0828)     LOS: 0 days    Time spent:5 min    Dmarcus Decicco, Roselind Messier, MD [facility]   If 7PM-7AM, please contact night-coverage 02/01/2022, 8:35 AM

## 2022-02-02 ENCOUNTER — Inpatient Hospital Stay (HOSPITAL_COMMUNITY): Payer: Medicaid Other

## 2022-02-02 DIAGNOSIS — F1290 Cannabis use, unspecified, uncomplicated: Secondary | ICD-10-CM

## 2022-02-02 DIAGNOSIS — F1910 Other psychoactive substance abuse, uncomplicated: Secondary | ICD-10-CM | POA: Diagnosis present

## 2022-02-02 DIAGNOSIS — J441 Chronic obstructive pulmonary disease with (acute) exacerbation: Secondary | ICD-10-CM | POA: Diagnosis present

## 2022-02-02 DIAGNOSIS — N760 Acute vaginitis: Secondary | ICD-10-CM | POA: Diagnosis present

## 2022-02-02 DIAGNOSIS — J4520 Mild intermittent asthma, uncomplicated: Secondary | ICD-10-CM | POA: Diagnosis not present

## 2022-02-02 DIAGNOSIS — R1084 Generalized abdominal pain: Secondary | ICD-10-CM

## 2022-02-02 DIAGNOSIS — R112 Nausea with vomiting, unspecified: Secondary | ICD-10-CM | POA: Diagnosis present

## 2022-02-02 DIAGNOSIS — E669 Obesity, unspecified: Secondary | ICD-10-CM | POA: Diagnosis present

## 2022-02-02 DIAGNOSIS — K920 Hematemesis: Secondary | ICD-10-CM

## 2022-02-02 DIAGNOSIS — J45909 Unspecified asthma, uncomplicated: Secondary | ICD-10-CM | POA: Diagnosis present

## 2022-02-02 DIAGNOSIS — B9689 Other specified bacterial agents as the cause of diseases classified elsewhere: Secondary | ICD-10-CM

## 2022-02-02 LAB — CBC WITH DIFFERENTIAL/PLATELET
Abs Immature Granulocytes: 0.01 10*3/uL (ref 0.00–0.07)
Basophils Absolute: 0 10*3/uL (ref 0.0–0.1)
Basophils Relative: 0 %
Eosinophils Absolute: 0.1 10*3/uL (ref 0.0–0.5)
Eosinophils Relative: 2 %
HCT: 41.5 % (ref 36.0–46.0)
Hemoglobin: 13.4 g/dL (ref 12.0–15.0)
Immature Granulocytes: 0 %
Lymphocytes Relative: 35 %
Lymphs Abs: 1.7 10*3/uL (ref 0.7–4.0)
MCH: 29.9 pg (ref 26.0–34.0)
MCHC: 32.3 g/dL (ref 30.0–36.0)
MCV: 92.6 fL (ref 80.0–100.0)
Monocytes Absolute: 0.6 10*3/uL (ref 0.1–1.0)
Monocytes Relative: 12 %
Neutro Abs: 2.5 10*3/uL (ref 1.7–7.7)
Neutrophils Relative %: 51 %
Platelets: 197 10*3/uL (ref 150–400)
RBC: 4.48 MIL/uL (ref 3.87–5.11)
RDW: 13.2 % (ref 11.5–15.5)
WBC: 4.9 10*3/uL (ref 4.0–10.5)
nRBC: 0 % (ref 0.0–0.2)

## 2022-02-02 LAB — COMPREHENSIVE METABOLIC PANEL
ALT: 19 U/L (ref 0–44)
AST: 15 U/L (ref 15–41)
Albumin: 3.4 g/dL — ABNORMAL LOW (ref 3.5–5.0)
Alkaline Phosphatase: 66 U/L (ref 38–126)
Anion gap: 8 (ref 5–15)
BUN: 8 mg/dL (ref 6–20)
CO2: 23 mmol/L (ref 22–32)
Calcium: 8.7 mg/dL — ABNORMAL LOW (ref 8.9–10.3)
Chloride: 108 mmol/L (ref 98–111)
Creatinine, Ser: 0.66 mg/dL (ref 0.44–1.00)
GFR, Estimated: >60 mL/min (ref >60)
Glucose, Bld: 78 mg/dL (ref 70–99)
Potassium: 3.5 mmol/L (ref 3.5–5.1)
Sodium: 139 mmol/L (ref 135–145)
Total Bilirubin: 0.6 mg/dL (ref 0.3–1.2)
Total Protein: 6.4 g/dL — ABNORMAL LOW (ref 6.5–8.1)

## 2022-02-02 LAB — PHOSPHORUS: Phosphorus: 3.4 mg/dL (ref 2.5–4.6)

## 2022-02-02 LAB — GC/CHLAMYDIA PROBE AMP (~~LOC~~) NOT AT ARMC
Chlamydia: NEGATIVE
Comment: NEGATIVE
Comment: NORMAL
Neisseria Gonorrhea: NEGATIVE

## 2022-02-02 LAB — MAGNESIUM: Magnesium: 2 mg/dL (ref 1.7–2.4)

## 2022-02-02 MED ORDER — NICOTINE 7 MG/24HR TD PT24
7.0000 mg | MEDICATED_PATCH | Freq: Every day | TRANSDERMAL | Status: AC
  Administered 2022-02-02: 7 mg via TRANSDERMAL
  Filled 2022-02-02: qty 1

## 2022-02-02 MED ORDER — TECHNETIUM TC 99M MEBROFENIN IV KIT
5.4000 | PACK | Freq: Once | INTRAVENOUS | Status: AC
  Administered 2022-02-02: 5.4 via INTRAVENOUS

## 2022-02-02 NOTE — Progress Notes (Signed)
  Transition of Care [facility]) Screening Note   Patient Details  Name: Kathryn Wiley Date of Birth: 05-22-1996   Transition of Care [facility]) CM/SW Contact:    Otelia Santee, LCSW Phone Number: 02/02/2022, 11:09 AM    Transition of Care Department [facility]) has reviewed patient and no TOC needs have been identified at this time. We will continue to monitor patient advancement through interdisciplinary progression rounds. If new patient transition needs arise, please place a TOC consult.

## 2022-02-02 NOTE — Progress Notes (Signed)
PROGRESS NOTE    Kathryn Wiley  YBO:[id number] DOB: Nov 17, 1995 DOA: 01/31/2022 PCP: Adela Glimpse, NP     Brief Narrative:   26 y.o. WF PMHx Anxiety, Asthma/COPD , family history significant for Chron's  disease,as well as iBS,  Presents for the second time in 4 days to Ed with recurrent n/v and abdominal pain. On initial evaluation patient was noted to have unremarkable  CT abd/pelvis and was discharged  home with antiemetic . Patient now returns 2 days later with recurrent of symptoms and in ability to tolerate po. Patient had repeat CT abdomen /pelvis which was again noted to be unremarkable. It was noted in ED that despite treatment with dilaudid IV and antiemetic patient continued to have refractory symptoms. Patient also noted that due to violent retching she noted flecks of blood in her emesis with last episode of this occurring in ED. She also notes intermittent loose stools that are light green w/o any noted blood or black stools. She also endorses chills and sweat intermittently. She denies recent travel , tainted foods or sick contacts.  She notes she has  had one prior episode of abdominal pain for which she presented to ED one year ago but s/p treatment in the ER those symptoms resolved. She denies any vaginal discharge or dysuria.   Subjective: 6/12 afebrile overnight A/O x4, sleeping peacefully.  When aroused still's states having abdominal pain.  Positive nausea.  States her boyfriend also has the same issues.  States smoking marijuana because she needed it to stimulate her hunger.  Last BM 1900 yesterday.   Assessment & Plan: Covid vaccination;   Principal Problem:   Intractable nausea and vomiting Active Problems:   Cannabinoid hyperemesis syndrome   Polysubstance abuse (HCC)   Hematemesis   Bacterial vaginosis   COPD exacerbation (HCC)   Asthma, chronic   Obesity (BMI 30-39.9)  Intractable nausea and Vomiting/Acute gastroenteritis/ Cannabinoid hyperemesis  syndrome -Multifactorial, marijuana use, bacterial vaginosis, anxiety? -supportive care bentyl/ antiemetic  -of note CT abd /pelvis NAD  -check stool studies /lactic /inflammatory markers  -check USD r/o marijuana induced emesis -gi consult for further assistance Dr Claretha Cooper - 6/12 discussed case with Dr. Marca Ancona GI plan is for HIDA scan.  Will await recommendations  Polysubstance abuse - 6/11 urine tox screen positive opiates, THC - 6/11 EtOH screen<10   Hematemesis -last episode close to 12 hours ago Lab Results  Component Value Date   HGB 13.4 02/02/2022   HGB 13.9 02/01/2022   HGB 14.5 02/01/2022   HGB 14.2 02/01/2022   HGB 14.4 02/01/2022  -Normal H/H -protonix iv bid and carafate as tolerated    Bacterial Vaginosis -clue cells noted on thin prep  -Metronidazole 500 mg BID -STD screen pending      Hx of Anxiety  -resume chronic medication    Leukocytosis -Resolved, secondary to BV?   Asthma/COPD  -no current flare  -prn nebs  -resume inhalers    Obesity 37.1 kg/m. -        Mobility Assessment (last 72 hours)     Mobility Assessment     Row Name 02/02/22 0100 02/01/22 0710 02/01/22 0000       Does patient have an order for bedrest or is patient medically unstable No - Continue assessment No - Continue assessment No - Continue assessment     What is the highest level of mobility based on the progressive mobility assessment? Level 6 (Walks independently in room and hall) - Balance while walking in  room without assist - Complete Level 6 (Walks independently in room and hall) - Balance while walking in room without assist - Complete Level 6 (Walks independently in room and hall) - Balance while walking in room without assist - Complete                Interdisciplinary Goals of Care Family Meeting   Date carried out: 02/02/2022  Location of the meeting:   Member's involved:   Durable Power of Insurance risk surveyor:      Discussion: We discussed goals of care for [name] .    Code status:   Disposition:   Time spent for the meeting:     Carisa Backhaus J, MD  02/02/2022, 11:31 AM         DVT prophylaxis: Lovenox Code Status: Full Family Communication:  Status is: Inpatient    Dispo: The patient is from: Home              Anticipated d/c is to: Home              Anticipated d/c date is: > 3 days              Patient currently is not medically stable to d/c.      Consultants:  GI Dr. Marca Ancona    Procedures/Significant Events:    I have personally reviewed and interpreted all radiology studies and my findings are as above.  VENTILATOR SETTINGS:    Cultures 6/11 GI panel pending 6/11 HIV nonreactive 6/11 Norovirus group 1 and 2 pending 6/11 GC/chlamydia urine pending 6/12 Syphilis RPR pending    Antimicrobials: Anti-infectives (From admission, onward)    Start     Ordered Stop   02/01/22 1100  metroNIDAZOLE (FLAGYL) IVPB 500 mg        02/01/22 0204 02/03/22 2259   01/31/22 2245  metroNIDAZOLE (FLAGYL) tablet 500 mg        01/31/22 2242 01/31/22 2247        Devices    LINES / TUBES:      Continuous Infusions:  metronidazole 500 mg (02/01/22 2201)     Objective: Vitals:   02/01/22 2036 02/01/22 2113 02/02/22 0500 02/02/22 0545  BP: 133/74   121/81  Pulse: 76   71  Resp: 16   18  Temp: 98.7 F (37.1 C)   98.4 F (36.9 C)  TempSrc: Oral   Oral  SpO2: 99% 98%  96%  Weight:   95.8 kg   Height:        Intake/Output Summary (Last 24 hours) at 02/02/2022 1131 Last data filed at 02/02/2022 0900 Gross per 24 hour  Intake 2366 ml  Output --  Net 2366 ml   Filed Weights   02/01/22 0057 02/01/22 0458 02/02/22 0500  Weight: 95 kg 95 kg 95.8 kg    Physical Exam:  General: A/O x4, No acute respiratory distress Eyes: negative scleral hemorrhage, negative anisocoria, negative icterus ENT: Negative Runny nose, negative gingival bleeding, Neck:   Negative scars, masses, torticollis, lymphadenopathy, JVD Lungs: Clear to auscultation bilaterally without wheezes or crackles Cardiovascular: Regular rate and rhythm without murmur gallop or rub normal S1 and S2 Abdomen: OBESE, positive abdominal pain to palpation generalized, nondistended, positive soft, bowel sounds, no rebound, no ascites, no appreciable mass Extremities: No significant cyanosis, clubbing, or edema bilateral lower extremities Skin: Negative rashes, lesions, ulcers Psychiatric:  Negative depression, negative anxiety, negative fatigue, negative mania  Central nervous system:  Cranial nerves II through XII intact, tongue/uvula midline, all extremities muscle strength 5/5, sensation intact throughout, negative dysarthria, negative expressive aphasia, negative receptive aphasia.    Data Reviewed: Care during the described time interval was provided by me .  I have reviewed this patient's available data, including medical history, events of note, physical examination, and all test results as part of my evaluation.  CBC: Recent Labs  Lab 01/29/22 1519 01/31/22 1715 02/01/22 0243 02/01/22 0503 02/01/22 1019 02/01/22 1834 02/02/22 0507  WBC 8.1 14.6*  --  7.6  --   --  4.9  NEUTROABS 4.7 13.6*  --   --   --   --  2.5  HGB 14.8 15.4* 14.4 14.2 14.5 13.9 13.4  HCT 43.7 44.7 43.4 42.5 44.5 42.6 41.5  MCV 90.9 87.8  --  91.8  --   --  92.6  PLT 253 260  --  193  --   --  197   Basic Metabolic Panel: Recent Labs  Lab 01/29/22 1519 01/31/22 1715 02/01/22 0243 02/01/22 0503 02/02/22 0507  NA 138 139 137 137 139  K 4.0 3.8 3.4* 3.5 3.5  CL 110 112* 107 108 108  CO2 24 19* 22 22 23   GLUCOSE 89 119* 111* 98 78  BUN 11 14 10 10 8   CREATININE 0.66 0.71 0.64 0.59 0.66  CALCIUM 8.9 9.3 8.9 8.7* 8.7*  MG  --   --  1.7  --  2.0  PHOS  --   --   --   --  3.4   GFR: Estimated Creatinine Clearance: 116.4 mL/min (by C-G formula based on SCr of 0.66 mg/dL). Liver Function  Tests: Recent Labs  Lab 01/29/22 1519 01/31/22 1715 02/01/22 0503 02/02/22 0507  AST 15 17 13* 15  ALT 20 22 17 19   ALKPHOS 75 85 71 66  BILITOT 0.7 0.9 0.6 0.6  PROT 6.9 7.5 6.3* 6.4*  ALBUMIN 3.8 4.3 3.5 3.4*   Recent Labs  Lab 01/29/22 1519 01/31/22 1715 02/01/22 1019  LIPASE 24 22 31    No results for input(s): "AMMONIA" in the last 168 hours. Coagulation Profile: No results for input(s): "INR", "PROTIME" in the last 168 hours. Cardiac Enzymes: No results for input(s): "CKTOTAL", "CKMB", "CKMBINDEX", "TROPONINI" in the last 168 hours. BNP (last 3 results) No results for input(s): "PROBNP" in the last 8760 hours. HbA1C: No results for input(s): "HGBA1C" in the last 72 hours. CBG: No results for input(s): "GLUCAP" in the last 168 hours. Lipid Profile: No results for input(s): "CHOL", "HDL", "LDLCALC", "TRIG", "CHOLHDL", "LDLDIRECT" in the last 72 hours. Thyroid Function Tests: No results for input(s): "TSH", "T4TOTAL", "FREET4", "T3FREE", "THYROIDAB" in the last 72 hours. Anemia Panel: No results for input(s): "VITAMINB12", "FOLATE", "FERRITIN", "TIBC", "IRON", "RETICCTPCT" in the last 72 hours. Sepsis Labs: Recent Labs  Lab 02/01/22 0243 02/01/22 0503  PROCALCITON <0.10  --   LATICACIDVEN 0.7 0.6    Recent Results (from the past 240 hour(s))  Wet prep, genital     Status: Abnormal   Collection Time: 01/31/22  8:58 PM  Result Value Ref Range Status   Yeast Wet Prep HPF POC NONE SEEN NONE SEEN Final   Trich, Wet Prep NONE SEEN NONE SEEN Final   Clue Cells Wet Prep HPF POC PRESENT (A) NONE SEEN Final   WBC, Wet Prep HPF POC <10 <10 Final   Sperm NONE SEEN  Final    Comment: Performed at [facility], [address] [place], [place] [id number]         Radiology Studies: CT ABDOMEN PELVIS W CONTRAST  Result Date: 01/31/2022 CLINICAL DATA:  Abdominal pain, acute, nonlocalized. Nausea, vomiting, diarrhea. EXAM: CT ABDOMEN AND PELVIS WITH  CONTRAST TECHNIQUE: Multidetector CT imaging of the abdomen and pelvis was performed using the standard protocol following bolus administration of intravenous contrast. RADIATION DOSE REDUCTION: This exam was performed according to the departmental dose-optimization program which includes automated exposure control, adjustment of the mA and/or kV according to patient size and/or use of iterative reconstruction technique. CONTRAST:  100mL OMNIPAQUE IOHEXOL 300 MG/ML  SOLN COMPARISON:  01/29/2022 FINDINGS: Lower chest: Wispy ground-glass opacity at the right lung base is again noted, nonspecific, atelectasis versus infiltrate. Visualized heart and pericardium are unremarkable. Hepatobiliary: No focal liver abnormality is seen. No gallstones, gallbladder wall thickening, or biliary dilatation. Pancreas: Unremarkable Spleen: Unremarkable Adrenals/Urinary Tract: The adrenal glands are unremarkable. The kidneys are normal in size and position. 2 mm nonobstructing calculus is seen within the lower pole of the left kidney. No hydronephrosis. No urolithiasis. The kidneys are otherwise unremarkable. The bladder is unremarkable. Stomach/Bowel: Stomach is within normal limits. Appendix appears normal. No evidence of bowel wall thickening, distention, or inflammatory changes. No free intraperitoneal gas or fluid. Vascular/Lymphatic: No significant vascular findings are present. No enlarged abdominal or pelvic lymph nodes. Reproductive: Intrauterine device in expected position. The pelvic organs are otherwise unremarkable. Other: No abdominal wall hernia. Musculoskeletal: Remote left eleventh rib fracture again identified. No acute bone abnormality. IMPRESSION: No acute intra-abdominal pathology identified. Minimal left nonobstructing nephrolithiasis. No urolithiasis. No hydronephrosis. Electronically Signed   By: Helyn NumbersAshesh  Parikh M.D.   On: 01/31/2022 18:52   DG Chest 2 View  Result Date: 01/31/2022 CLINICAL DATA:  Follow-up  ground-glass densities EXAM: CHEST - 2 VIEW COMPARISON:  None Available. FINDINGS: Normal mediastinum and cardiac silhouette. Normal pulmonary vasculature. No evidence of effusion, infiltrate, or pneumothorax. No acute bony abnormality. IMPRESSION: No acute cardiopulmonary process. Electronically Signed   By: Genevive BiStewart  Edmunds M.D.   On: 01/31/2022 18:24        Scheduled Meds:  enoxaparin (LOVENOX) injection  40 mg Subcutaneous QHS   mometasone-formoterol  2 puff Inhalation BID   pantoprazole (PROTONIX) IV  40 mg Intravenous Q12H   sodium chloride flush  3 mL Intravenous Q12H   sucralfate  1 g Oral TID WC & HS   Continuous Infusions:  metronidazole 500 mg (02/01/22 2201)     LOS: 1 day    Time spent:5 min    Makari Sanko, Roselind MessierURTIS J, MD [facility]   If 7PM-7AM, please contact night-coverage 02/02/2022, 11:31 AM

## 2022-02-02 NOTE — Progress Notes (Signed)
This nurse could smell smoke in the hallway and found pt to be leaning out the window. She had discarded the cigarette. I asked the pt to give me the cigarettes and lighter. I explained that smoking is a danger in the hospital with oxygen in her room and we could get her a nicotine patch. She became tearful when apologizing. I explained that everything is okay, we just need to be safe. Nicotine patch ordered.

## 2022-02-02 NOTE — Progress Notes (Signed)
[facility] Gastroenterology Progress Note  Kathryn Wiley 26 y.o. [birth year]  CC:  nausea, vomiting, abdominal pain   Subjective: Patient seen and examined laying in bed.  Notes she continues to have nausea despite Zofran.  Notes 1 episode of emesis yesterday after drinking clear liquids.  She has generalized abdominal pain.  ROS : Review of Systems  Gastrointestinal:  Positive for abdominal pain, nausea and vomiting. Negative for blood in stool, constipation, diarrhea, heartburn and melena.  Genitourinary:  Negative for dysuria and urgency.      Objective: Vital signs in last 24 hours: Vitals:   02/01/22 2113 02/02/22 0545  BP:  121/81  Pulse:  71  Resp:  18  Temp:  98.4 F (36.9 C)  SpO2: 98% 96%    Physical Exam:  General:  Alert, cooperative, no distress, appears stated age  Head:  Normocephalic, without obvious abnormality, atraumatic  Eyes:  Anicteric sclera, EOM's intact  Lungs:   Clear to auscultation bilaterally, respirations unlabored  Heart:  Regular rate and rhythm, S1, S2 normal  Abdomen:   Soft, generalized abdominal tenderness, bowel sounds active all four quadrants,  no masses,   Extremities: Extremities normal, atraumatic, no  edema  Pulses: 2+ and symmetric    Lab Results: Recent Labs    02/01/22 0243 02/01/22 0503 02/02/22 0507  NA 137 137 139  K 3.4* 3.5 3.5  CL 107 108 108  CO2 22 22 23   GLUCOSE 111* 98 78  BUN 10 10 8   CREATININE 0.64 0.59 0.66  CALCIUM 8.9 8.7* 8.7*  MG 1.7  --  2.0  PHOS  --   --  3.4   Recent Labs    02/01/22 0503 02/02/22 0507  AST 13* 15  ALT 17 19  ALKPHOS 71 66  BILITOT 0.6 0.6  PROT 6.3* 6.4*  ALBUMIN 3.5 3.4*   Recent Labs    01/31/22 1715 02/01/22 0243 02/01/22 0503 02/01/22 1019 02/01/22 1834 02/02/22 0507  WBC 14.6*  --  7.6  --   --  4.9  NEUTROABS 13.6*  --   --   --   --  2.5  HGB 15.4*   < > 14.2   < > 13.9 13.4  HCT 44.7   < > 42.5   < > 42.6 41.5  MCV 87.8  --  91.8  --   --  92.6  PLT  260  --  193  --   --  197   < > = values in this interval not displayed.   No results for input(s): "LABPROT", "INR" in the last 72 hours.    Assessment Nausea vomiting abdominal pain  Patient with history of marijuana use and positive UDS on admission.  Possible cannabinoid hyperemesis.  Patient with abdominal pain starting on Wednesday.  Possible worsening abdominal pain due to multiple episodes of emesis for possible biliary etiology.  CT abdomen pelvis 01/31/2022 No acute intra-abdominal pathology.  Normal LFTs, hemoglobin stable,  no leukocytosis  Awaiting results of GI pathogen panel and norovirus testing Awaiting fecal calprotectin   Plan: Continue clear liquid diet HIDA scan with ejection fraction ordered for possible biliary dyskinesia. Counseled on marijuana abstinence. Continue pantoprazole 40 mg twice daily. Continue ondansetron 4 mg every 6 hours as needed [facility] will follow  Wednesday PA-C 02/02/2022, 11:18 AM  Contact #  [phone]

## 2022-02-03 DIAGNOSIS — A0811 Acute gastroenteropathy due to Norwalk agent: Secondary | ICD-10-CM | POA: Diagnosis present

## 2022-02-03 LAB — GASTROINTESTINAL PANEL BY PCR, STOOL (REPLACES STOOL CULTURE)

## 2022-02-03 LAB — CBC WITH DIFFERENTIAL/PLATELET
Abs Immature Granulocytes: 0.01 10*3/uL (ref 0.00–0.07)
Basophils Absolute: 0 10*3/uL (ref 0.0–0.1)
Basophils Relative: 1 %
Eosinophils Absolute: 0.1 10*3/uL (ref 0.0–0.5)
Eosinophils Relative: 2 %
HCT: 41.1 % (ref 36.0–46.0)
Hemoglobin: 13.7 g/dL (ref 12.0–15.0)
Immature Granulocytes: 0 %
Lymphocytes Relative: 35 %
Lymphs Abs: 2 10*3/uL (ref 0.7–4.0)
MCH: 30.5 pg (ref 26.0–34.0)
MCHC: 33.3 g/dL (ref 30.0–36.0)
MCV: 91.5 fL (ref 80.0–100.0)
Monocytes Absolute: 0.5 10*3/uL (ref 0.1–1.0)
Monocytes Relative: 9 %
Neutro Abs: 3.1 10*3/uL (ref 1.7–7.7)
Neutrophils Relative %: 53 %
Platelets: 215 10*3/uL (ref 150–400)
RBC: 4.49 MIL/uL (ref 3.87–5.11)
RDW: 13.2 % (ref 11.5–15.5)
WBC: 5.8 10*3/uL (ref 4.0–10.5)
nRBC: 0 % (ref 0.0–0.2)

## 2022-02-03 LAB — COMPREHENSIVE METABOLIC PANEL
ALT: 18 U/L (ref 0–44)
AST: 15 U/L (ref 15–41)
Albumin: 3.8 g/dL (ref 3.5–5.0)
Alkaline Phosphatase: 68 U/L (ref 38–126)
Anion gap: 7 (ref 5–15)
BUN: 9 mg/dL (ref 6–20)
CO2: 25 mmol/L (ref 22–32)
Calcium: 8.9 mg/dL (ref 8.9–10.3)
Chloride: 109 mmol/L (ref 98–111)
Creatinine, Ser: 0.67 mg/dL (ref 0.44–1.00)
GFR, Estimated: >60 mL/min (ref >60)
Glucose, Bld: 96 mg/dL (ref 70–99)
Potassium: 3.2 mmol/L — ABNORMAL LOW (ref 3.5–5.1)
Sodium: 141 mmol/L (ref 135–145)
Total Bilirubin: 0.4 mg/dL (ref 0.3–1.2)
Total Protein: 6.6 g/dL (ref 6.5–8.1)

## 2022-02-03 LAB — RPR: RPR Ser Ql: NONREACTIVE

## 2022-02-03 LAB — MAGNESIUM: Magnesium: 1.9 mg/dL (ref 1.7–2.4)

## 2022-02-03 LAB — PHOSPHORUS: Phosphorus: 3.9 mg/dL (ref 2.5–4.6)

## 2022-02-03 MED ORDER — NICOTINE 7 MG/24HR TD PT24
7.0000 mg | MEDICATED_PATCH | Freq: Every day | TRANSDERMAL | Status: DC
  Administered 2022-02-03: 7 mg via TRANSDERMAL
  Filled 2022-02-03 (×2): qty 1

## 2022-02-03 MED ORDER — POTASSIUM CHLORIDE 10 MEQ/100ML IV SOLN
10.0000 meq | INTRAVENOUS | Status: AC
  Administered 2022-02-03 – 2022-02-04 (×5): 10 meq via INTRAVENOUS
  Filled 2022-02-03 (×3): qty 100

## 2022-02-03 MED ORDER — METRONIDAZOLE 500 MG/100ML IV SOLN
500.0000 mg | Freq: Two times a day (BID) | INTRAVENOUS | Status: DC
  Administered 2022-02-03 (×2): 500 mg via INTRAVENOUS
  Filled 2022-02-03 (×2): qty 100

## 2022-02-03 MED ORDER — SODIUM CHLORIDE 0.9 % IV SOLN: INTRAVENOUS | Status: DC

## 2022-02-03 NOTE — Progress Notes (Addendum)
[facility] Gastroenterology Progress Note  Kathryn Wiley 26 y.o. May 27, 1996  CC: Nausea, vomiting, abdominal pain   Subjective: Patient seen and examined laying in bed notes continued pain and nausea overnight.  Patient is currently NPO.  Patient notes several family members have had their gallbladders removed previously.  ROS : Review of Systems  Gastrointestinal:  Positive for abdominal pain, nausea and vomiting. Negative for blood in stool, constipation, diarrhea, heartburn and melena.  Genitourinary:  Negative for dysuria and urgency.      Objective: Vital signs in last 24 hours: Vitals:   02/02/22 2213 02/03/22 0440  BP: 113/75 125/82  Pulse: 67 82  Resp: 20 20  Temp: 98.1 F (36.7 C) 98.3 F (36.8 C)  SpO2: 93% 100%    Physical Exam:  General:  Alert, cooperative, no distress, appears stated age  Head:  Normocephalic, without obvious abnormality, atraumatic  Eyes:  Anicteric sclera, EOM's intact  Lungs:   Clear to auscultation bilaterally, respirations unlabored  Heart:  Regular rate and rhythm, S1, S2 normal  Abdomen:   Soft, right upper quadrant tenderness, bowel sounds active all four quadrants,  no masses,   Extremities: Extremities normal, atraumatic, no  edema  Pulses: 2+ and symmetric    Lab Results: Recent Labs    02/02/22 0507 02/03/22 0501  NA 139 141  K 3.5 3.2*  CL 108 109  CO2 23 25  GLUCOSE 78 96  BUN 8 9  CREATININE 0.66 0.67  CALCIUM 8.7* 8.9  MG 2.0 1.9  PHOS 3.4 3.9   Recent Labs    02/02/22 0507 02/03/22 0501  AST 15 15  ALT 19 18  ALKPHOS 66 68  BILITOT 0.6 0.4  PROT 6.4* 6.6  ALBUMIN 3.4* 3.8   Recent Labs    02/02/22 0507 02/03/22 0501  WBC 4.9 5.8  NEUTROABS 2.5 3.1  HGB 13.4 13.7  HCT 41.5 41.1  MCV 92.6 91.5  PLT 197 215   No results for input(s): "LABPROT", "INR" in the last 72 hours.    Assessment Nausea, vomiting, abdominal pain  Patient with history of marijuana use and positive UDS on admission.   Possible cannabinoid hyperemesis.   Patient with abdominal pain starting on Wednesday.  Possible worsening abdominal pain due to multiple episodes of emesis for possible biliary etiology.   CT abdomen pelvis 01/31/2022 No acute intra-abdominal pathology.  Awaiting HIDA scan results.   Normal LFTs, hemoglobin stable,  no leukocytosis   GI pathogen panel positive for Norovirus.  Awaiting fecal calprotectin.    Plan: If HIDA scan is abnormal consider surgery consult for possible cholecystectomy.  If HIDA scan is normal may resume clear liquid diet as tolerated. Recommend IV fluids for Norovirus infection.  Continue pantoprazole 40 mg twice daily Continue pain control and antiemetics as needed [facility] will follow for HIDA scan results.  Arvella Nigh Kathryn Klepper PA-C 02/03/2022, 9:58 AM  Contact #  [phone]

## 2022-02-03 NOTE — Progress Notes (Signed)
PROGRESS NOTE    Kathryn Wiley  WJX:[id number] DOB: 1996-02-16 DOA: 01/31/2022 PCP: No primary care provider on file.     Brief Narrative:   26 y.o. WF PMHx Anxiety, Asthma/COPD , family history significant for Chron's  disease,as well as iBS,  Presents for the second time in 4 days to Ed with recurrent n/v and abdominal pain. On initial evaluation patient was noted to have unremarkable  CT abd/pelvis and was discharged  home with antiemetic . Patient now returns 2 days later with recurrent of symptoms and in ability to tolerate po. Patient had repeat CT abdomen /pelvis which was again noted to be unremarkable. It was noted in ED that despite treatment with dilaudid IV and antiemetic patient continued to have refractory symptoms. Patient also noted that due to violent retching she noted flecks of blood in her emesis with last episode of this occurring in ED. She also notes intermittent loose stools that are light green w/o any noted blood or black stools. She also endorses chills and sweat intermittently. She denies recent travel , tainted foods or sick contacts.  She notes she has  had one prior episode of abdominal pain for which she presented to ED one year ago but s/p treatment in the ER those symptoms resolved. She denies any vaginal discharge or dysuria.   Subjective: 6/13 afebrile overnight    afebrile overnight A/O x4, sleeping peacefully.  When aroused still's states having abdominal pain.  Positive nausea.  States her boyfriend also has the same issues.  States smoking marijuana because she needed it to stimulate her hunger.  Last BM 1900 yesterday.   Assessment & Plan: Covid vaccination;   Principal Problem:   Intractable nausea and vomiting Active Problems:   Cannabinoid hyperemesis syndrome   Polysubstance abuse (HCC)   Hematemesis   Bacterial vaginosis   COPD exacerbation (HCC)   Asthma, chronic   Obesity (BMI 30-39.9)   Infection due to Norovirus species  Intractable  nausea and Vomiting/Acute gastroenteritis/ Cannabinoid hyperemesis syndrome -Multifactorial, marijuana use, bacterial vaginosis, anxiety? -supportive care bentyl/ antiemetic  -of note CT abd /pelvis NAD  -check stool studies /lactic /inflammatory markers  -check USD r/o marijuana induced emesis -gi consult for further assistance Dr Claretha CooperMagood - 6/12 discussed case with [facility] plan is for HIDA scan.  Will await recommendations -6/13 HIDA scan results pending  Polysubstance abuse - 6/11 urine tox screen positive opiates, THC - 6/11 EtOH screen<10   Hematemesis -last episode close to 12 hours ago Lab Results  Component Value Date   HGB 13.7 02/03/2022   HGB 13.4 02/02/2022   HGB 13.9 02/01/2022   HGB 14.5 02/01/2022   HGB 14.2 02/01/2022  -Normal H/H -protonix iv bid and carafate as tolerated    Bacterial Vaginosis -clue cells noted on thin prep  -Metronidazole 500 mg BID -STD screen negative  Norovirus infection - 6/13 patient consult that there was no cure except for keeping hydrated and time. - 6/13 normal saline 3475ml/hr   Hx of Anxiety  -resume chronic medication    Leukocytosis -Resolved, secondary to BV?   Asthma/COPD  -no current flare  -prn nebs  -resume inhalers  -6/13 overnight patient caught by RN smoking in her room.  Hypokalemia - Potassium goal> 4 - 6/13 Potassium IV 50 mEq   Obesity 37.1 kg/m. -        Mobility Assessment (last 72 hours)     Mobility Assessment     Row Name 02/02/22 0100 02/01/22 0710  02/01/22 0000       Does patient have an order for bedrest or is patient medically unstable No - Continue assessment No - Continue assessment No - Continue assessment     What is the highest level of mobility based on the progressive mobility assessment? Level 6 (Walks independently in room and hall) - Balance while walking in room without assist - Complete Level 6 (Walks independently in room and hall) - Balance while walking in room  without assist - Complete Level 6 (Walks independently in room and hall) - Balance while walking in room without assist - Complete                Interdisciplinary Goals of Care Family Meeting   Date carried out: 02/03/2022  Location of the meeting:   Member's involved:   Durable Power of Insurance risk surveyor:     Discussion: We discussed goals of care for [name] .    Code status:   Disposition:   Time spent for the meeting:     Kathryn Shi J, MD  02/03/2022, 12:55 PM         DVT prophylaxis: Lovenox Code Status: Full Family Communication:  Status is: Inpatient    Dispo: The patient is from: Home              Anticipated d/c is to: Home              Anticipated d/c date is: > 3 days              Patient currently is not medically stable to d/c.      Consultants:  GI Dr. Marca Ancona    Procedures/Significant Events:  6/12 HIDA scan results pending  I have personally reviewed and interpreted all radiology studies and my findings are as above.  VENTILATOR SETTINGS:    Cultures 6/11 GI panel positive norovirus  6/11 HIV nonreactive 6/11 Norovirus group 1 and 2 pending 6/11 GC/chlamydia urine negative 6/12 Syphilis RPR negative    Antimicrobials: Anti-infectives (From admission, onward)    Start     Ordered Stop   02/01/22 1100  metroNIDAZOLE (FLAGYL) IVPB 500 mg        02/01/22 0204 02/03/22 2259   01/31/22 2245  metroNIDAZOLE (FLAGYL) tablet 500 mg        01/31/22 2242 01/31/22 2247        Devices    LINES / TUBES:      Continuous Infusions:  sodium chloride     metronidazole     potassium chloride       Objective: Vitals:   02/02/22 0545 02/02/22 1500 02/02/22 2213 02/03/22 0440  BP: 121/81 136/82 113/75 125/82  Pulse: 71 86 67 82  Resp: 18 17 20 20   Temp: 98.4 F (36.9 C) 98.7 F (37.1 C) 98.1 F (36.7 C) 98.3 F (36.8 C)  TempSrc: Oral Oral Oral Oral  SpO2: 96% 97% 93% 100%   Weight:      Height:        Intake/Output Summary (Last 24 hours) at 02/03/2022 1255 Last data filed at 02/02/2022 2207 Gross per 24 hour  Intake 562.86 ml  Output --  Net 562.86 ml   Filed Weights   02/01/22 0057 02/01/22 0458 02/02/22 0500  Weight: 95 kg 95 kg 95.8 kg    Physical Exam:  General: A/O x4, No acute respiratory distress Eyes: negative scleral hemorrhage, negative anisocoria, negative icterus ENT: Negative Runny nose, negative  gingival bleeding, Neck:  Negative scars, masses, torticollis, lymphadenopathy, JVD Lungs: Clear to auscultation bilaterally without wheezes or crackles Cardiovascular: Regular rate and rhythm without murmur gallop or rub normal S1 and S2 Abdomen: OBESE, positive abdominal pain to palpation generalized, nondistended, positive soft, bowel sounds, no rebound, no ascites, no appreciable mass Extremities: No significant cyanosis, clubbing, or edema bilateral lower extremities Skin: Negative rashes, lesions, ulcers Psychiatric:  Negative depression, negative anxiety, negative fatigue, negative mania  Central nervous system:  Cranial nerves II through XII intact, tongue/uvula midline, all extremities muscle strength 5/5, sensation intact throughout, negative dysarthria, negative expressive aphasia, negative receptive aphasia.    Data Reviewed: Care during the described time interval was provided by me .  I have reviewed this patient's available data, including medical history, events of note, physical examination, and all test results as part of my evaluation.  CBC: Recent Labs  Lab 01/29/22 1519 01/31/22 1715 02/01/22 0243 02/01/22 0503 02/01/22 1019 02/01/22 1834 02/02/22 0507 02/03/22 0501  WBC 8.1 14.6*  --  7.6  --   --  4.9 5.8  NEUTROABS 4.7 13.6*  --   --   --   --  2.5 3.1  HGB 14.8 15.4*   < > 14.2 14.5 13.9 13.4 13.7  HCT 43.7 44.7   < > 42.5 44.5 42.6 41.5 41.1  MCV 90.9 87.8  --  91.8  --   --  92.6 91.5  PLT 253 260  --   193  --   --  197 215   < > = values in this interval not displayed.   Basic Metabolic Panel: Recent Labs  Lab 01/31/22 1715 02/01/22 0243 02/01/22 0503 02/02/22 0507 02/03/22 0501  NA 139 137 137 139 141  K 3.8 3.4* 3.5 3.5 3.2*  CL 112* 107 108 108 109  CO2 19* GLUCOSE 119* 111* 98 78 96  BUN CREATININE 0.71 0.64 0.59 0.66 0.67  CALCIUM 9.3 8.9 8.7* 8.7* 8.9  MG  --  1.7  --  2.0 1.9  PHOS  --   --   --  3.4 3.9   GFR: Estimated Creatinine Clearance: 116.4 mL/min (by C-G formula based on SCr of 0.67 mg/dL). Liver Function Tests: Recent Labs  Lab 01/29/22 1519 01/31/22 1715 02/01/22 0503 02/02/22 0507 02/03/22 0501  AST 15 17 13* 15 15  ALT ALKPHOS 75 85 71 66 68  BILITOT 0.7 0.9 0.6 0.6 0.4  PROT 6.9 7.5 6.3* 6.4* 6.6  ALBUMIN 3.8 4.3 3.5 3.4* 3.8   Recent Labs  Lab 01/29/22 1519 01/31/22 1715 02/01/22 1019  LIPASE No results for input(s): "AMMONIA" in the last 168 hours. Coagulation Profile: No results for input(s): "INR", "PROTIME" in the last 168 hours. Cardiac Enzymes: No results for input(s): "CKTOTAL", "CKMB", "CKMBINDEX", "TROPONINI" in the last 168 hours. BNP (last 3 results) No results for input(s): "PROBNP" in the last 8760 hours. HbA1C: No results for input(s): "HGBA1C" in the last 72 hours. CBG: No results for input(s): "GLUCAP" in the last 168 hours. Lipid Profile: No results for input(s): "CHOL", "HDL", "LDLCALC", "TRIG", "CHOLHDL", "LDLDIRECT" in the last 72 hours. Thyroid Function Tests: No results for input(s): "TSH", "T4TOTAL", "FREET4", "T3FREE", "THYROIDAB" in the last 72 hours. Anemia Panel: No results for input(s): "VITAMINB12", "FOLATE", "FERRITIN", "TIBC", "IRON", "RETICCTPCT" in the last 72 hours. Sepsis Labs: Recent Labs  Lab 02/01/22  3893 02/01/22 0503  PROCALCITON <0.10  --   LATICACIDVEN 0.7 0.6    Recent Results (from the past 240 hour(s))  Wet prep, genital      Status: Abnormal   Collection Time: 01/31/22  8:58 PM  Result Value Ref Range Status   Yeast Wet Prep HPF POC NONE SEEN NONE SEEN Final   Trich, Wet Prep NONE SEEN NONE SEEN Final   Clue Cells Wet Prep HPF POC PRESENT (A) NONE SEEN Final   WBC, Wet Prep HPF POC <10 <10 Final   Sperm NONE SEEN  Final    Comment: Performed at [facility], [address] [place], [place] [address]  Gastrointestinal Panel by PCR , Stool     Status: Abnormal   Collection Time: 02/01/22 11:24 AM   Specimen: Stool  Result Value Ref Range Status   Campylobacter species NOT DETECTED NOT DETECTED Final   Plesimonas shigelloides NOT DETECTED NOT DETECTED Final   Salmonella species NOT DETECTED NOT DETECTED Final   Yersinia enterocolitica NOT DETECTED NOT DETECTED Final   Vibrio species NOT DETECTED NOT DETECTED Final   Vibrio cholerae NOT DETECTED NOT DETECTED Final   Enteroaggregative E coli (EAEC) NOT DETECTED NOT DETECTED Final   Enteropathogenic E coli (EPEC) NOT DETECTED NOT DETECTED Final   Enterotoxigenic E coli (ETEC) NOT DETECTED NOT DETECTED Final   Shiga like toxin producing E coli (STEC) NOT DETECTED NOT DETECTED Final   Shigella/Enteroinvasive E coli (EIEC) NOT DETECTED NOT DETECTED Final   Cryptosporidium NOT DETECTED NOT DETECTED Final   Cyclospora cayetanensis NOT DETECTED NOT DETECTED Final   Entamoeba histolytica NOT DETECTED NOT DETECTED Final   Giardia lamblia NOT DETECTED NOT DETECTED Final   Adenovirus F40/41 NOT DETECTED NOT DETECTED Final   Astrovirus NOT DETECTED NOT DETECTED Final   Norovirus GI/GII DETECTED (A) NOT DETECTED Final    Comment: RESULT CALLED TO, READ BACK BY AND VERIFIED WITH: DEBORAH DOWELL ON 02/03/22 @ 957 RP    Rotavirus A NOT DETECTED NOT DETECTED Final   Sapovirus (I, II, IV, and V) NOT DETECTED NOT DETECTED Final    Comment: Performed at [facility], [address] [place], [place] [address]         Radiology Studies: No  results found.      Scheduled Meds:  enoxaparin (LOVENOX) injection  40 mg Subcutaneous QHS   mometasone-formoterol  2 puff Inhalation BID   nicotine  7 mg Transdermal Daily   pantoprazole (PROTONIX) IV  40 mg Intravenous Q12H   sodium chloride flush  3 mL Intravenous Q12H   sucralfate  1 g Oral TID WC & HS   Continuous Infusions:  sodium chloride     metronidazole     potassium chloride       LOS: 2 days    Time spent:5 min    Anaira Seay, Roselind Messier, MD [facility]   If 7PM-7AM, please contact night-coverage 02/03/2022, 12:55 PM

## 2022-02-04 DIAGNOSIS — J441 Chronic obstructive pulmonary disease with (acute) exacerbation: Secondary | ICD-10-CM

## 2022-02-04 LAB — COMPREHENSIVE METABOLIC PANEL
ALT: 15 U/L (ref 0–44)
AST: 18 U/L (ref 15–41)
Albumin: 3.6 g/dL (ref 3.5–5.0)
Alkaline Phosphatase: 63 U/L (ref 38–126)
Anion gap: 9 (ref 5–15)
BUN: 6 mg/dL (ref 6–20)
CO2: 23 mmol/L (ref 22–32)
Calcium: 9.2 mg/dL (ref 8.9–10.3)
Chloride: 106 mmol/L (ref 98–111)
Creatinine, Ser: 0.68 mg/dL (ref 0.44–1.00)
GFR, Estimated: >60 mL/min (ref >60)
Glucose, Bld: 86 mg/dL (ref 70–99)
Potassium: 4.1 mmol/L (ref 3.5–5.1)
Sodium: 138 mmol/L (ref 135–145)
Total Bilirubin: 0.9 mg/dL (ref 0.3–1.2)
Total Protein: 6.5 g/dL (ref 6.5–8.1)

## 2022-02-04 LAB — CBC WITH DIFFERENTIAL/PLATELET
Abs Immature Granulocytes: 0.01 10*3/uL (ref 0.00–0.07)
Basophils Absolute: 0 10*3/uL (ref 0.0–0.1)
Basophils Relative: 1 %
Eosinophils Absolute: 0.1 10*3/uL (ref 0.0–0.5)
Eosinophils Relative: 2 %
HCT: 44.8 % (ref 36.0–46.0)
Hemoglobin: 14.2 g/dL (ref 12.0–15.0)
Immature Granulocytes: 0 %
Lymphocytes Relative: 39 %
Lymphs Abs: 2.1 10*3/uL (ref 0.7–4.0)
MCH: 30.1 pg (ref 26.0–34.0)
MCHC: 31.7 g/dL (ref 30.0–36.0)
MCV: 94.9 fL (ref 80.0–100.0)
Monocytes Absolute: 0.4 10*3/uL (ref 0.1–1.0)
Monocytes Relative: 8 %
Neutro Abs: 2.7 10*3/uL (ref 1.7–7.7)
Neutrophils Relative %: 50 %
Platelets: 208 10*3/uL (ref 150–400)
RBC: 4.72 MIL/uL (ref 3.87–5.11)
RDW: 13.2 % (ref 11.5–15.5)
WBC: 5.4 10*3/uL (ref 4.0–10.5)
nRBC: 0 % (ref 0.0–0.2)

## 2022-02-04 LAB — NOROVIRUS GROUP 1 & 2 BY PCR, STOOL
Norovirus 1 by PCR: POSITIVE — AB
Norovirus 2  by PCR: NEGATIVE

## 2022-02-04 LAB — MAGNESIUM: Magnesium: 1.9 mg/dL (ref 1.7–2.4)

## 2022-02-04 LAB — PHOSPHORUS: Phosphorus: 3.1 mg/dL (ref 2.5–4.6)

## 2022-02-04 MED ORDER — METRONIDAZOLE 500 MG PO TABS: 500.0000 mg | ORAL_TABLET | Freq: Two times a day (BID) | ORAL | 0 refills | Status: AC

## 2022-02-04 MED ORDER — ONDANSETRON HCL 4 MG PO TABS: 4.0000 mg | ORAL_TABLET | Freq: Three times a day (TID) | ORAL | 0 refills | Status: AC | PRN

## 2022-02-04 MED ORDER — METRONIDAZOLE 500 MG PO TABS
500.0000 mg | ORAL_TABLET | Freq: Two times a day (BID) | ORAL | Status: DC
  Administered 2022-02-04: 500 mg via ORAL
  Filled 2022-02-04: qty 1

## 2022-02-04 NOTE — Discharge Summary (Signed)
Physician Discharge Summary   Patient: Kathryn Wiley MRN: [id number] DOB: [birth year]  Admit date:     01/31/2022  Discharge date: 02/04/22  Discharge Physician: Rickey Barbara   PCP: No primary care provider on file.   Recommendations at discharge:    Follow up with PCP in 1-2 weeks Follow up with General Surgery as needed  Discharge Diagnoses: Principal Problem:   Intractable nausea and vomiting Active Problems:   Cannabinoid hyperemesis syndrome   Polysubstance abuse (HCC)   Hematemesis   Bacterial vaginosis   COPD exacerbation (HCC)   Asthma, chronic   Obesity (BMI 30-39.9)   Infection due to Norovirus species  Resolved Problems:   * No resolved hospital problems. [facility] Course: No notes on file  Assessment and Plan: No notes have been filed under this hospital service. Service: Hospitalist  Intractable nausea and Vomiting/Acute gastroenteritis secondary to norovirus -of note CT abd /pelvis NAD  -stool studies pos for norovirus -gi consulted with recs for continued supportive care -HIDA was found to have 0% EF. Discussed with General Surgery. No further recommendations at this time. Pt to f/u with Surgery on as-needed basis -Symptoms resolved. Pt able to tolerate PO well, stable for d/c   Polysubstance abuse - 6/11 urine tox screen positive opiates, THC - 6/11 EtOH screen<10    Bacterial Vaginosis -clue cells noted on thin prep  -Metronidazole 500 mg BID, prescribed on d/c -STD screen negative   Norovirus infection - 6/13 patient consult that there was no cure except for keeping hydrated and time. - 6/13 normal saline 46ml/hr   Hx of Anxiety  -continued chronic medication    Leukocytosis -Resolved   Asthma/COPD  -no current flare  -prn nebs  -resumed inhalers    Hypokalemia - Potassium corrected       Consultants:  Procedures performed:   Disposition: Home Diet recommendation:  Regular diet DISCHARGE MEDICATION: Allergies as of  02/04/2022   No Known Allergies      Medication List     STOP taking these medications    benzoyl peroxide-erythromycin gel Commonly known as: Benzamycin   promethazine 25 MG suppository Commonly known as: PHENERGAN   valACYclovir 500 MG tablet Commonly known as: VALTREX       TAKE these medications    buPROPion ER 100 MG 12 hr tablet Commonly known as: WELLBUTRIN SR Take 100 mg by mouth every morning.   LEXAPRO PO Take 10 mg by mouth daily.   metroNIDAZOLE 500 MG tablet Commonly known as: FLAGYL Take 1 tablet (500 mg total) by mouth every 12 (twelve) hours for 5 days.   Symbicort 160-4.5 MCG/ACT inhaler Generic drug: budesonide-formoterol Inhale 2 puffs into the lungs daily as needed (for shortness of breath).   Ventolin HFA 108 (90 Base) MCG/ACT inhaler Generic drug: albuterol Inhale 1-2 puffs into the lungs every 6 (six) hours as needed for wheezing or shortness of breath.        Follow-up Information     Surgery, [place]. Call.   Specialty: General Surgery Why: As needed if you feel like you might be having symptoms of biliary colic and would like to discuss elective surgery to remove your gallbladder. Contact information: [address] [place] [address] [phone]         Follow up with PCP in 1-2 weeks Follow up.   Why: Hospital follow up               Discharge Exam: Filed Weights  02/01/22 0057 02/01/22 0458 02/02/22 0500  Weight: 95 kg 95 kg 95.8 kg   General exam: Awake, laying in bed, in nad Respiratory system: Normal respiratory effort, no wheezing Cardiovascular system: regular rate, s1, s2 Gastrointestinal system: Soft, nondistended, positive BS Central nervous system: CN2-12 grossly intact, strength intact Extremities: Perfused, no clubbing Skin: Normal skin turgor, no notable skin lesions seen Psychiatry: Mood normal // no visual hallucinations   Condition at discharge: fair  The results of  significant diagnostics from this hospitalization (including imaging, microbiology, ancillary and laboratory) are listed below for reference.   Imaging Studies: NM Hepato W/EF  Result Date: 02/04/2022 CLINICAL DATA:  Abdominal pain. EXAM: NUCLEAR MEDICINE HEPATOBILIARY IMAGING WITH GALLBLADDER EF TECHNIQUE: Sequential images of the abdomen were obtained out to 60 minutes following intravenous administration of radiopharmaceutical. After oral ingestion of Ensure, gallbladder ejection fraction was determined. At 60 min, normal ejection fraction is greater than 33%. RADIOPHARMACEUTICALS:  5.5 mCi Tc-5324m  Choletec IV COMPARISON:  None Available. FINDINGS: Prompt clearance radiotracer from the blood pool and homogeneous uptake in liver. Counts are evident in the small bowel by 20 minutes. The gallbladder begins to fill at 60 minutes and continues to fill up to 90 minutes. At 90 minutes, a fatty meal was provided. No measurable contraction of the gallbladder following fatty meal stimulation. Calculated gallbladder ejection fraction is 0%. (Normal gallbladder ejection fraction with Ensure is greater than 33%.) IMPRESSION: 1. Failure of the gallbladder to contract upon fatty meal stimulation. 2. Patent cystic duct and common bile duct. These results will be called to the ordering clinician or representative by the Radiologist Assistant, and communication documented in the PACS or [employer] Dashboard. Electronically Signed   By: Genevive BiStewart  Edmunds M.D.   On: 02/04/2022 10:07   CT ABDOMEN PELVIS W CONTRAST  Result Date: 01/31/2022 CLINICAL DATA:  Abdominal pain, acute, nonlocalized. Nausea, vomiting, diarrhea. EXAM: CT ABDOMEN AND PELVIS WITH CONTRAST TECHNIQUE: Multidetector CT imaging of the abdomen and pelvis was performed using the standard protocol following bolus administration of intravenous contrast. RADIATION DOSE REDUCTION: This exam was performed according to the departmental dose-optimization program which  includes automated exposure control, adjustment of the mA and/or kV according to patient size and/or use of iterative reconstruction technique. CONTRAST:  100mL OMNIPAQUE IOHEXOL 300 MG/ML  SOLN COMPARISON:  01/29/2022 FINDINGS: Lower chest: Wispy ground-glass opacity at the right lung base is again noted, nonspecific, atelectasis versus infiltrate. Visualized heart and pericardium are unremarkable. Hepatobiliary: No focal liver abnormality is seen. No gallstones, gallbladder wall thickening, or biliary dilatation. Pancreas: Unremarkable Spleen: Unremarkable Adrenals/Urinary Tract: The adrenal glands are unremarkable. The kidneys are normal in size and position. 2 mm nonobstructing calculus is seen within the lower pole of the left kidney. No hydronephrosis. No urolithiasis. The kidneys are otherwise unremarkable. The bladder is unremarkable. Stomach/Bowel: Stomach is within normal limits. Appendix appears normal. No evidence of bowel wall thickening, distention, or inflammatory changes. No free intraperitoneal gas or fluid. Vascular/Lymphatic: No significant vascular findings are present. No enlarged abdominal or pelvic lymph nodes. Reproductive: Intrauterine device in expected position. The pelvic organs are otherwise unremarkable. Other: No abdominal wall hernia. Musculoskeletal: Remote left eleventh rib fracture again identified. No acute bone abnormality. IMPRESSION: No acute intra-abdominal pathology identified. Minimal left nonobstructing nephrolithiasis. No urolithiasis. No hydronephrosis. Electronically Signed   By: Helyn NumbersAshesh  Parikh M.D.   On: 01/31/2022 18:52   DG Chest 2 View  Result Date: 01/31/2022 CLINICAL DATA:  Follow-up ground-glass densities EXAM: CHEST - 2 VIEW  COMPARISON:  None Available. FINDINGS: Normal mediastinum and cardiac silhouette. Normal pulmonary vasculature. No evidence of effusion, infiltrate, or pneumothorax. No acute bony abnormality. IMPRESSION: No acute cardiopulmonary process.  Electronically Signed   By: Genevive Bi M.D.   On: 01/31/2022 18:24   CT ABDOMEN PELVIS W CONTRAST  Result Date: 01/29/2022 CLINICAL DATA:  Acute abdominal pain nonlocalized. EXAM: CT ABDOMEN AND PELVIS WITH CONTRAST TECHNIQUE: Multidetector CT imaging of the abdomen and pelvis was performed using the standard protocol following bolus administration of intravenous contrast. RADIATION DOSE REDUCTION: This exam was performed according to the departmental dose-optimization program which includes automated exposure control, adjustment of the mA and/or kV according to patient size and/or use of iterative reconstruction technique. CONTRAST:  OMNIPAQUE IOHEXOL 300 MG/ML  SOLN COMPARISON:  None Available. FINDINGS: Lower chest: Patchy ground-glass opacities in the right lung base are favored infectious or inflammatory. Hepatobiliary: No suspicious hepatic lesion. Gallbladder is unremarkable. No biliary ductal dilation. Pancreas: No pancreatic ductal dilation or evidence of acute inflammation. Spleen: No splenomegaly or focal splenic lesion. Adrenals/Urinary Tract: Bilateral adrenal glands appear normal. No hydronephrosis. Punctate nonobstructive left lower pole renal calculus. Kidneys demonstrate symmetric enhancement. Urinary bladder is unremarkable for degree of distension. Stomach/Bowel: No radiopaque enteric contrast material was administered. Stomach is unremarkable for degree of distension. No pathologic dilation of large or small bowel. The appendix and terminal ileum appear normal. Few scattered colonic diverticula without findings of acute diverticulitis. Vascular/Lymphatic: Normal caliber abdominal aorta. No pathologically enlarged abdominal or pelvic lymph nodes. Reproductive: Intrauterine device appears appropriate in positioning. No suspicious adnexal mass. Other: Trace pelvic free fluid is within physiologic normal limits. Musculoskeletal: No acute osseous abnormality. IMPRESSION: 1. No acute  abnormality in the abdomen or pelvis. 2. Patchy ground-glass opacities in the right lung base are favored infectious or inflammatory. 3. Scattered colonic diverticulosis without findings of acute diverticulitis. Electronically Signed   By: Maudry Mayhew M.D.   On: 01/29/2022 16:09    Microbiology: Results for orders placed or performed during the hospital encounter of 01/31/22  Wet prep, genital     Status: Abnormal   Collection Time: 01/31/22  8:58 PM  Result Value Ref Range Status   Yeast Wet Prep HPF POC NONE SEEN NONE SEEN Final   Trich, Wet Prep NONE SEEN NONE SEEN Final   Clue Cells Wet Prep HPF POC PRESENT (A) NONE SEEN Final   WBC, Wet Prep HPF POC <10 <10 Final   Sperm NONE SEEN  Final    Comment: Performed at [facility], [address] [place], [place] [address]  Gastrointestinal Panel by PCR , Stool     Status: Abnormal   Collection Time: 02/01/22 11:24 AM   Specimen: Stool  Result Value Ref Range Status   Campylobacter species NOT DETECTED NOT DETECTED Final   Plesimonas shigelloides NOT DETECTED NOT DETECTED Final   Salmonella species NOT DETECTED NOT DETECTED Final   Yersinia enterocolitica NOT DETECTED NOT DETECTED Final   Vibrio species NOT DETECTED NOT DETECTED Final   Vibrio cholerae NOT DETECTED NOT DETECTED Final   Enteroaggregative E coli (EAEC) NOT DETECTED NOT DETECTED Final   Enteropathogenic E coli (EPEC) NOT DETECTED NOT DETECTED Final   Enterotoxigenic E coli (ETEC) NOT DETECTED NOT DETECTED Final   Shiga like toxin producing E coli (STEC) NOT DETECTED NOT DETECTED Final   Shigella/Enteroinvasive E coli (EIEC) NOT DETECTED NOT DETECTED Final   Cryptosporidium NOT DETECTED NOT DETECTED Final   Cyclospora cayetanensis  NOT DETECTED NOT DETECTED Final   Entamoeba histolytica NOT DETECTED NOT DETECTED Final   Giardia lamblia NOT DETECTED NOT DETECTED Final   Adenovirus F40/41 NOT DETECTED NOT DETECTED Final   Astrovirus NOT DETECTED NOT DETECTED  Final   Norovirus GI/GII DETECTED (A) NOT DETECTED Final    Comment: RESULT CALLED TO, READ BACK BY AND VERIFIED WITH: DEBORAH DOWELL ON 02/03/22 @ 957 RP    Rotavirus A NOT DETECTED NOT DETECTED Final   Sapovirus (I, II, IV, and V) NOT DETECTED NOT DETECTED Final    Comment: Performed at [facility], [address] [place], [place] [address]    Labs: CBC: Recent Labs  Lab 01/29/22 1519 01/31/22 1715 02/01/22 0243 02/01/22 0503 02/01/22 1019 02/01/22 1834 02/02/22 0507 02/03/22 0501 02/04/22 0619  WBC 8.1 14.6*  --  7.6  --   --  4.9 5.8 5.4  NEUTROABS 4.7 13.6*  --   --   --   --  2.5 3.1 2.7  HGB 14.8 15.4*   < > 14.2 14.5 13.9 13.4 13.7 14.2  HCT 43.7 44.7   < > 42.5 44.5 42.6 41.5 41.1 44.8  MCV 90.9 87.8  --  91.8  --   --  92.6 91.5 94.9  PLT 253 260  --  193  --   --  197 215 208   < > = values in this interval not displayed.   Basic Metabolic Panel: Recent Labs  Lab 02/01/22 0243 02/01/22 0503 02/02/22 0507 02/03/22 0501 02/04/22 0619  NA 137 137 139 141 138  K 3.4* 3.5 3.5 3.2* 4.1  CL 107 108 108 109 106  CO2 22 22 23 25 23   GLUCOSE 111* 98 78 96 86  BUN 10 10 8 9 6   CREATININE 0.64 0.59 0.66 0.67 0.68  CALCIUM 8.9 8.7* 8.7* 8.9 9.2  MG 1.7  --  2.0 1.9 1.9  PHOS  --   --  3.4 3.9 3.1   Liver Function Tests: Recent Labs  Lab 01/31/22 1715 02/01/22 0503 02/02/22 0507 02/03/22 0501 02/04/22 0619  AST 17 13* 15 15 18   ALT 22 17 19 18 15   ALKPHOS 85 71 66 68 63  BILITOT 0.9 0.6 0.6 0.4 0.9  PROT 7.5 6.3* 6.4* 6.6 6.5  ALBUMIN 4.3 3.5 3.4* 3.8 3.6   CBG: No results for input(s): "GLUCAP" in the last 168 hours.  Discharge time spent: less than 30 minutes.  Signed: 02/05/22, MD [facility] 02/04/2022

## 2022-02-04 NOTE — Consult Note (Signed)
Consult Note  Kathryn Wiley Jul 16, 1996  ZK:[id number].    Requesting MD: Marylu Lund, MD Chief Complaint/Reason for Consult: abnormal HIDA scan   HPI:  Patient is a 26 year old female who is currently admitted with nausea, vomiting, diarrhea and generalized abdominal pain. GI was consulted and she was found to be positive for Norovirus. She has been treated with supportive care. HIDA was ordered as part of workup and showed an EF of 0% but patency of cystic duct and CBD. She reports pain when it started was more on the left and then radiated to the right. She has no signs of inflammation around the gallbladder on CT scan 6/8 or 6/10. She has no leukocytosis and no elevation in LFTs. She does not describe a hx of symptoms consistent with biliary colic. She otherwise has PMH of anxiety and asthma. She recently started taking Lexapro and also smokes marijuana to stimulate appetite. Significant other at the bedside as well. No prior abdominal surgery.   ROS: Review of Systems  Constitutional:  Negative for chills and fever.  Respiratory:  Negative for shortness of breath and wheezing.   Cardiovascular:  Negative for chest pain and palpitations.  Gastrointestinal:  Positive for abdominal pain, diarrhea, nausea and vomiting.  Genitourinary:  Negative for dysuria, frequency and urgency.  All other systems reviewed and are negative.   History reviewed. No pertinent family history.  Past Medical History:  Diagnosis Date   Anxiety    Asthma    COPD (chronic obstructive pulmonary disease) ([place])     History reviewed. No pertinent surgical history.  Social History:  reports that she has quit smoking. Her smoking use included cigarettes. She has never used smokeless tobacco. She reports current alcohol use. She reports current drug use. Drug: Marijuana.  Allergies: No Known Allergies  Medications Prior to Admission  Medication Sig Dispense Refill   Escitalopram Oxalate (LEXAPRO PO)  Take 10 mg by mouth daily.     SYMBICORT 160-4.5 MCG/ACT inhaler Inhale 2 puffs into the lungs daily as needed (for shortness of breath).     VENTOLIN HFA 108 (90 Base) MCG/ACT inhaler Inhale 1-2 puffs into the lungs every 6 (six) hours as needed for wheezing or shortness of breath.     benzoyl peroxide-erythromycin (BENZAMYCIN) gel Apply topically 2 (two) times daily. (Patient not taking: Reported on 02/01/2022) 23.3 g 0   buPROPion ER (WELLBUTRIN SR) 100 MG 12 hr tablet Take 100 mg by mouth every morning. (Patient not taking: Reported on 02/01/2022)     promethazine (PHENERGAN) 25 MG suppository Place 1 suppository (25 mg total) rectally every 6 (six) hours as needed for nausea or vomiting. (Patient not taking: Reported on 02/01/2022) 12 each 0   valACYclovir (VALTREX) 500 MG tablet Take 500 mg by mouth daily. (Patient not taking: Reported on 02/01/2022)      Blood pressure 117/67, pulse 63, temperature 97.9 F (36.6 C), temperature source Oral, resp. rate 16, height 5\' 3"  (1.6 m), weight 95.8 kg, SpO2 99 %. Physical Exam:  General: pleasant, WD, overweight female who is laying in bed in NAD HEENT: head is normocephalic, atraumatic.  Sclera are anicteric.  Ears and nose without any masses or lesions.  Mouth is pink and moist Heart: regular, rate, and rhythm.   Lungs: no audible wheezing Respiratory effort nonlabored Abd: soft, mild generalized ttp without peritonitis, ND, no masses, hernias, or organomegaly MS: all 4 extremities are symmetrical with no cyanosis, clubbing, or edema. Skin: warm  and dry with no masses, lesions, or rashes Psych: A&Ox3 with an appropriate affect.   Results for orders placed or performed during the hospital encounter of 01/31/22 (from the past 48 hour(s))  Comprehensive metabolic panel     Status: Abnormal   Collection Time: 02/03/22  5:01 AM  Result Value Ref Range   Sodium 141 135 - 145 mmol/L   Potassium 3.2 (L) 3.5 - 5.1 mmol/L   Chloride 109 98 - 111 mmol/L    CO2 25 22 - 32 mmol/L   Glucose, Bld 96 70 - 99 mg/dL    Comment: Glucose reference range applies only to samples taken after fasting for at least 8 hours.   BUN 9 6 - 20 mg/dL   Creatinine, Ser 0.67 0.44 - 1.00 mg/dL   Calcium 8.9 8.9 - 10.3 mg/dL   Total Protein 6.6 6.5 - 8.1 g/dL   Albumin 3.8 3.5 - 5.0 g/dL   AST 15 15 - 41 U/L   ALT 18 0 - 44 U/L   Alkaline Phosphatase 68 38 - 126 U/L   Total Bilirubin 0.4 0.3 - 1.2 mg/dL   GFR, Estimated >60 >60 mL/min    Comment: (NOTE) Calculated using the CKD-EPI Creatinine Equation (2021)    Anion gap 7 5 - 15    Comment: Performed at [facility], [place] [address], [place], [place] [address]  Phosphorus     Status: None   Collection Time: 02/03/22  5:01 AM  Result Value Ref Range   Phosphorus 3.9 2.5 - 4.6 mg/dL    Comment: Performed at [facility], [place] [address] [place], [place] [address]  Magnesium     Status: None   Collection Time: 02/03/22  5:01 AM  Result Value Ref Range   Magnesium 1.9 1.7 - 2.4 mg/dL    Comment: Performed at [facility], [place] [address], [place], [place] [address]  CBC with Differential/Platelet     Status: None   Collection Time: 02/03/22  5:01 AM  Result Value Ref Range   WBC 5.8 4.0 - 10.5 K/uL   RBC 4.49 3.87 - 5.11 MIL/uL   Hemoglobin 13.7 12.0 - 15.0 g/dL   HCT 41.1 36.0 - 46.0 %   MCV 91.5 80.0 - 100.0 fL   MCH 30.5 26.0 - 34.0 pg   MCHC 33.3 30.0 - 36.0 g/dL   RDW 13.2 11.5 - 15.5 %   Platelets 215 150 - 400 K/uL   nRBC 0.0 0.0 - 0.2 %   Neutrophils Relative % 53 %   Neutro Abs 3.1 1.7 - 7.7 K/uL   Lymphocytes Relative 35 %   Lymphs Abs 2.0 0.7 - 4.0 K/uL   Monocytes Relative 9 %   Monocytes Absolute 0.5 0.1 - 1.0 K/uL   Eosinophils Relative 2 %   Eosinophils Absolute 0.1 0.0 - 0.5 K/uL   Basophils Relative 1 %   Basophils Absolute 0.0 0.0 - 0.1 K/uL   Immature Granulocytes 0 %   Abs Immature Granulocytes 0.01 0.00 - 0.07 K/uL     Comment: Performed at [facility], [place] [address], [place], [place] [address]  Comprehensive metabolic panel     Status: None   Collection Time: 02/04/22  6:19 AM  Result Value Ref Range   Sodium 138 135 - 145 mmol/L   Potassium 4.1 3.5 - 5.1 mmol/L    Comment: DELTA CHECK NOTED SLIGHT HEMOLYSIS    Chloride 106 98 - 111  mmol/L   CO2 23 22 - 32 mmol/L   Glucose, Bld 86 70 - 99 mg/dL    Comment: Glucose reference range applies only to samples taken after fasting for at least 8 hours.   BUN 6 6 - 20 mg/dL   Creatinine, Ser 0.68 0.44 - 1.00 mg/dL   Calcium 9.2 8.9 - 10.3 mg/dL   Total Protein 6.5 6.5 - 8.1 g/dL   Albumin 3.6 3.5 - 5.0 g/dL   AST 18 15 - 41 U/L   ALT 15 0 - 44 U/L   Alkaline Phosphatase 63 38 - 126 U/L   Total Bilirubin 0.9 0.3 - 1.2 mg/dL   GFR, Estimated >60 >60 mL/min    Comment: (NOTE) Calculated using the CKD-EPI Creatinine Equation (2021)    Anion gap 9 5 - 15    Comment: Performed at [facility], [place] [address] [place], [place] [address]  Phosphorus     Status: None   Collection Time: 02/04/22  6:19 AM  Result Value Ref Range   Phosphorus 3.1 2.5 - 4.6 mg/dL    Comment: Performed at [facility], [place] [address], [place], [place] [address]  Magnesium     Status: None   Collection Time: 02/04/22  6:19 AM  Result Value Ref Range   Magnesium 1.9 1.7 - 2.4 mg/dL    Comment: Performed at [facility], [place] [address], [place], [place] [address]  CBC with Differential/Platelet     Status: None   Collection Time: 02/04/22  6:19 AM  Result Value Ref Range   WBC 5.4 4.0 - 10.5 K/uL   RBC 4.72 3.87 - 5.11 MIL/uL   Hemoglobin 14.2 12.0 - 15.0 g/dL   HCT 44.8 36.0 - 46.0 %   MCV 94.9 80.0 - 100.0 fL   MCH 30.1 26.0 - 34.0 pg   MCHC 31.7 30.0 - 36.0 g/dL   RDW 13.2 11.5 - 15.5 %   Platelets 208 150 - 400 K/uL   nRBC 0.0 0.0 - 0.2 %   Neutrophils Relative % 50 %   Neutro Abs 2.7  1.7 - 7.7 K/uL   Lymphocytes Relative 39 %   Lymphs Abs 2.1 0.7 - 4.0 K/uL   Monocytes Relative 8 %   Monocytes Absolute 0.4 0.1 - 1.0 K/uL   Eosinophils Relative 2 %   Eosinophils Absolute 0.1 0.0 - 0.5 K/uL   Basophils Relative 1 %   Basophils Absolute 0.0 0.0 - 0.1 K/uL   Immature Granulocytes 0 %   Abs Immature Granulocytes 0.01 0.00 - 0.07 K/uL    Comment: Performed at [facility], [place] [address] [place],  [address]   NM Hepato W/EF  Result Date: 02/04/2022 CLINICAL DATA:  Abdominal pain. EXAM: NUCLEAR MEDICINE HEPATOBILIARY IMAGING WITH GALLBLADDER EF TECHNIQUE: Sequential images of the abdomen were obtained out to 60 minutes following intravenous administration of radiopharmaceutical. After oral ingestion of Ensure, gallbladder ejection fraction was determined. At 60 min, normal ejection fraction is greater than 33%. RADIOPHARMACEUTICALS:  5.5 mCi Tc-65m  Choletec IV COMPARISON:  None Available. FINDINGS: Prompt clearance radiotracer from the blood pool and homogeneous uptake in liver. Counts are evident in the small bowel by 20 minutes. The gallbladder begins to fill at 60 minutes and continues to fill up to 90 minutes. At 90 minutes, a fatty meal was provided. No measurable contraction of the gallbladder following fatty meal stimulation. Calculated gallbladder ejection fraction is 0%. (Normal gallbladder ejection  fraction with Ensure is greater than 33%.) IMPRESSION: 1. Failure of the gallbladder to contract upon fatty meal stimulation. 2. Patent cystic duct and common bile duct. These results will be called to the ordering clinician or representative by the Radiologist Assistant, and communication documented in the PACS or [employer]. Electronically Signed   By: Suzy Bouchard M.D.   On: 02/04/2022 10:07      Assessment/Plan Nausea and vomiting Viral enteritis Marijuana use - patient currently positive for Norovirus and also has a Hx of marijuana  use - HIDA is a dynamic test and EF of 0% is non-specific for biliary dyskinesia in setting of acute viral enteritis  - patient has no signs of acute cholecystitis on other imaging and more recent CT actually does not see any gallstones - she has no leukocytosis and no elevation in her LFTs - would not recommend laparoscopic cholecystectomy at this time. Did discuss what classic biliary colic symptoms are with patient and informed her that we would be happy to see her in the office to discuss elective cholecystectomy if she feels like she is experiencing those symptoms in the future. General surgery will sign off at this time    I reviewed Consultant GI notes, hospitalist notes, last 24 h vitals and pain scores, last 48 h intake and output, last 24 h labs and trends, and last 24 h imaging results.  Norm Parcel, [facility] Surgery 02/04/2022, 1:53 PM Please see Am[facility] for pager number during day hours 7:00am-4:30pm

## 2022-02-04 NOTE — Plan of Care (Signed)

## 2022-06-01 ENCOUNTER — Ambulatory Visit (INDEPENDENT_AMBULATORY_CARE_PROVIDER_SITE_OTHER): Payer: Medicaid Other | Admitting: Podiatry

## 2022-06-01 DIAGNOSIS — M722 Plantar fascial fibromatosis: Secondary | ICD-10-CM | POA: Diagnosis not present

## 2022-06-01 DIAGNOSIS — L210 Seborrhea capitis: Secondary | ICD-10-CM

## 2022-06-01 DIAGNOSIS — L84 Corns and callosities: Secondary | ICD-10-CM

## 2022-06-01 DIAGNOSIS — M79671 Pain in right foot: Secondary | ICD-10-CM

## 2022-06-01 MED ORDER — MELOXICAM 15 MG PO TABS: 15.0000 mg | ORAL_TABLET | Freq: Every day | ORAL | 0 refills | Status: AC

## 2022-06-01 NOTE — Progress Notes (Signed)
  Subjective:  Patient ID: Kathryn Wiley, female    DOB: 02/12/1996,  MRN: [id number]  Chief Complaint  Patient presents with   Callouses    26 y.o. female presents with the above complaint.  Patient presents for pain in bilateral heels.  She has previously been seen by Dr. Cannon Kettle in the past for callus present to the bilateral heels.  She was recommended to use Epsom salt soaks lotion and pumice stone to slowly remove the calluses present.  She has not been doing any of this advised treatment.  She also goes barefoot against recommendation previously.  She notes that she has pain in the bilateral heels left worse than right feels like a stabbing and electric type pain especially with ambulation.  Review of Systems: Negative except as noted in the HPI. Denies N/V/F/Ch.   Objective:  There were no vitals filed for this visit. There is no height or weight on file to calculate BMI. Constitutional Well developed. Well nourished.  Vascular Dorsalis pedis pulses palpable bilaterally. Posterior tibial pulses palpable bilaterally. Capillary refill normal to all digits.  No cyanosis or clubbing noted. Pedal hair growth normal.  Neurologic Normal speech. Oriented to person, place, and time. Epicritic sensation to light touch grossly present bilaterally.  Dermatologic Nails well groomed and normal in appearance. No open wounds. No skin lesions.  Orthopedic: Normal joint ROM without pain or crepitus bilaterally. No visible deformities. Tender to palpation at the calcaneal tuber bilaterally. No pain with calcaneal squeeze bilaterally. Ankle ROM diminished range of motion bilaterally. Silfverskiold Test: negative bilaterally.   Radiographs: Deferred at this visit.   Assessment:   1. Plantar fasciitis, bilateral   2. Callus   3. Pityriasis   4. Foot pain, bilateral    Plan:  Patient was evaluated and treated and all questions answered.  Plantar Fasciitis, bilaterally - XR reviewed as  above.  - Educated on icing and stretching. Instructions given.  - Injection delivered to the plantar fascia as below. - Recommend use of OTC insert for shoes, patient wishes to defer at this time.  - Pharmacologic management: Meloxicam 15 mg take once daily as needed for pain. Educated on risks/benefits and proper taking of medication.  Procedure: Injection Tendon/Ligament Location: Bilateral plantar fascia at the glabrous junction; medial approach. Skin Prep: alcohol Injectate: 1 cc 0.5% marcaine plain, 1 cc kenalog 10. Disposition: Patient tolerated procedure well. Injection site dressed with a band-aid.  #Callus plantar heel bilateral, hyperhidrosis -Encouraged daily skin emollients and use of pumice stone at home on her own after soaking with black tea twice-three times weekly -Recommend patient to get the over-the-counter spray deodorant to use at bedtime for hyperhidrosis -Advised good supportive shoes that do not rub and to continue with her cushioned sandals  Return in about 6 weeks (around 07/13/2022) for Bilateral PF.

## 2022-07-13 ENCOUNTER — Ambulatory Visit: Payer: Medicaid Other

## 2022-07-13 ENCOUNTER — Ambulatory Visit (INDEPENDENT_AMBULATORY_CARE_PROVIDER_SITE_OTHER): Payer: Medicaid Other | Admitting: Podiatry

## 2022-07-13 DIAGNOSIS — M722 Plantar fascial fibromatosis: Secondary | ICD-10-CM | POA: Diagnosis not present

## 2022-07-13 DIAGNOSIS — L84 Corns and callosities: Secondary | ICD-10-CM | POA: Diagnosis not present

## 2022-07-13 DIAGNOSIS — M792 Neuralgia and neuritis, unspecified: Secondary | ICD-10-CM

## 2022-07-13 NOTE — Progress Notes (Signed)
  Subjective:  Patient ID: Kathryn Wiley, female    DOB: May 15, 1996,  MRN: [id number]  Chief Complaint  Patient presents with   Follow-up    6 week follow up bilateral PF.    26 y.o. female presents with the above complaint.  Patient states that she has been doing better since last appointment.  She thinks that the steroid injection did help with though it did flareup her pain for approximately 2 to 3 days after the injection.  She also notices some pins-and-needles burning tingling pain in both feet that she is concerned may be neuropathy as she does have a family history of having neuropathy.  She denies a history of diabetes.  Review of Systems: Negative except as noted in the HPI. Denies N/V/F/Ch.   Objective:  There were no vitals filed for this visit. There is no height or weight on file to calculate BMI. Constitutional Well developed. Well nourished.  Vascular Dorsalis pedis pulses palpable bilaterally. Posterior tibial pulses palpable bilaterally. Capillary refill normal to all digits.  No cyanosis or clubbing noted. Pedal hair growth normal.  Neurologic Normal speech. Oriented to person, place, and time. Epicritic sensation to light touch grossly present bilaterally.  Dermatologic Nails well groomed and normal in appearance. No open wounds. Broad diffuse callus of bilateral heel no open wound   Orthopedic: Normal joint ROM without pain or crepitus bilaterally. No visible deformities. Decreased and minimally tender to palpation at the calcaneal tuber bilaterally. No pain with calcaneal squeeze bilaterally. Ankle ROM diminished range of motion bilaterally. Silfverskiold Test: negative bilaterally.   Radiographs: Deferred at this visit.   Assessment:   1. Callus   2. Neuropathic pain   3. Plantar fasciitis, bilateral    Plan:  Patient was evaluated and treated and all questions answered.  Plantar Fasciitis, bilaterally, improved status post bilateral heel  injections at last visit - XR reviewed as above.  - Educated on icing and stretching. Instructions given.  -No further steroid injection indicated at this time - Recommend use of OTC insert for shoes, patient wishes to defer at this time.  - Pharmacologic management: Meloxicam 15 mg take once daily as needed for pain. Educated on risks/benefits and proper taking of medication.  #Neuropathic pain -Discussed possible use of gabapentin if neuropathic pain worsens. -Patient will discuss with primary care doctor and consider if the pain worsens.  #Callus plantar heel bilateral, hyperhidrosis -Encouraged daily skin emollients and use of pumice stone at home on her own after soaking with black tea twice-three times weekly -Recommend patient to get the over-the-counter spray deodorant to use at bedtime for hyperhidrosis -Advised good supportive shoes that do not rub and to continue with her cushioned sandals  Return if symptoms worsen or fail to improve.
